# Patient Record
Sex: Female | Born: 1990 | Race: Black or African American | Hispanic: No | Marital: Single | Smoking: Current every day smoker
Health system: Southern US, Community
[De-identification: ages and names within clinical notes are randomized; demographics above are authoritative.]

---

## 2011-11-14 DIAGNOSIS — R0789 Other chest pain: Secondary | ICD-10-CM | POA: Insufficient documentation

## 2011-11-14 DIAGNOSIS — Z87891 Personal history of nicotine dependence: Secondary | ICD-10-CM | POA: Insufficient documentation

## 2011-11-15 ENCOUNTER — Emergency Department (HOSPITAL_BASED_OUTPATIENT_CLINIC_OR_DEPARTMENT_OTHER)
Admission: EM | Admit: 2011-11-15 | Discharge: 2011-11-15 | Disposition: A | Discharge status: Home / Self Care | Payer: Medicaid Other | Attending: Emergency Medicine | Admitting: Emergency Medicine

## 2011-11-15 ENCOUNTER — Encounter (HOSPITAL_BASED_OUTPATIENT_CLINIC_OR_DEPARTMENT_OTHER): Payer: Self-pay | Admitting: *Deleted

## 2011-11-15 ENCOUNTER — Emergency Department (HOSPITAL_BASED_OUTPATIENT_CLINIC_OR_DEPARTMENT_OTHER): Payer: Medicaid Other

## 2011-11-15 DIAGNOSIS — R0789 Other chest pain: Secondary | ICD-10-CM

## 2011-11-15 LAB — PREGNANCY, URINE: Preg Test, Ur: NEGATIVE

## 2011-11-15 LAB — D-DIMER, QUANTITATIVE: D-Dimer, Quant: 0.24 ug/mL-FEU (ref 0.00–0.48)

## 2011-11-15 MED ORDER — IBUPROFEN 800 MG PO TABS: 800.0000 mg | ORAL_TABLET | Freq: Three times a day (TID) | ORAL | Status: AC

## 2011-11-15 NOTE — ED Notes (Signed)
Patient states while sitting in class started having some tightness in her chest that caused her to experience some SOB.

## 2011-11-15 NOTE — ED Notes (Signed)
Pt states she was sitting in class on Tues and dev CP and shortness of breath. Worse with deep inspiration. Denies other s/s. Has happened off and on since. Neck "stiff" No pain at present.

## 2011-11-15 NOTE — ED Provider Notes (Signed)
History   This chart was scribed for Bonnie May Smitty Cords, MD by [name]. The patient was seen in room MH05/MH05. Patient's care was started at 2359.  CSN: [id number]  Arrival date & time 11/14/11  2359   First MD Initiated Contact with Patient 11/15/11 0018      Chief Complaint  Patient presents with  . Shortness of Breath    Patient is a 21 y.o. female presenting with shortness of breath and chest pain. The history is provided by the patient. No language interpreter was used.  Shortness of Breath  The current episode started 5 to 7 days ago. The onset was sudden. The problem occurs continuously (with intermittent sharp pains in chest and continuos chest tightness). The problem has been unchanged. The problem is moderate. Nothing relieves the symptoms. Nothing aggravates the symptoms. Associated symptoms include chest pain and shortness of breath. Pertinent negatives include no chest pressure, no fever, no rhinorrhea, no sore throat, no stridor, no cough and no wheezing. There was no intake of a foreign body. She has not inhaled smoke recently. She has had no prior steroid use. She has had no prior hospitalizations. Her past medical history does not include asthma. She has been behaving normally. Urine output has been normal. The last void occurred 6 to 12 hours ago. There were no sick contacts. She has received no recent medical care.  Chest Pain The chest pain began 5 - 7 days ago. Chest pain occurs constantly. The chest pain is unchanged. The pain is associated with breathing. At its most intense, the pain is at 7/10. The pain is currently at 7/10. The severity of the pain is severe. The quality of the pain is described as pleuritic and pressure-like. The pain does not radiate. Chest pain is worsened by deep breathing. Primary symptoms include shortness of breath. Pertinent negatives for primary symptoms include no fever, no fatigue, no syncope, no cough, no wheezing, no palpitations, no  abdominal pain, no nausea, no vomiting, no dizziness and no altered mental status.  The patient's medical history does not include asthma.  Pertinent negatives for associated symptoms include no claudication, no diaphoresis, no lower extremity edema and no weakness. Risk factors include no known risk factors.  Pertinent negatives for past medical history include no MI.  Procedure history is negative for cardiac catheterization.   denied neck pain or stiffness to EDP  History reviewed. No pertinent past medical history.  History reviewed. No pertinent past surgical history.  History reviewed. No pertinent family history.  History  Substance Use Topics  . Smoking status: Former Games developer  . Smokeless tobacco: Not on file  . Alcohol Use: No    Review of Systems  Constitutional: Negative for fever, diaphoresis and fatigue.  HENT: Negative for sore throat and rhinorrhea.   Eyes: Negative for pain.  Respiratory: Positive for chest tightness and shortness of breath. Negative for cough, wheezing and stridor.   Cardiovascular: Positive for chest pain. Negative for palpitations, claudication and syncope.  Gastrointestinal: Negative for nausea, vomiting, abdominal pain and diarrhea.  Genitourinary: Negative for dysuria.  Musculoskeletal: Negative for back pain.  Skin: Negative for rash.  Neurological: Negative for dizziness, weakness and headaches.  Psychiatric/Behavioral: Negative for altered mental status.  All other systems reviewed and are negative.    Allergies  Review of patient's allergies indicates no known allergies.  Home Medications  No current outpatient prescriptions on file.  BP 124/71  Pulse 88  Temp 98.1 F (36.7 C) (Oral)  Resp 18  Ht 5\' 6"  (1.676 m)  Wt 146 lb (66.225 kg)  BMI 23.56 kg/m2  SpO2 99%  LMP 10/14/2011  Physical Exam  Constitutional: She is oriented to person, place, and time. She appears well-developed and well-nourished. No distress.  HENT:    Head: Normocephalic and atraumatic.  Eyes: EOM are normal. Pupils are equal, round, and reactive to light. Right eye exhibits no discharge. Left eye exhibits no discharge.  Neck: Normal range of motion. Neck supple.  Cardiovascular: Normal rate, regular rhythm and normal heart sounds.   No murmur heard. Pulmonary/Chest: Effort normal and breath sounds normal. No respiratory distress. She has no wheezes. She has no rales. She exhibits tenderness.  Abdominal: There is no tenderness. There is no rebound and no guarding.  Musculoskeletal: Normal range of motion. She exhibits no edema.  Lymphadenopathy:    She has no cervical adenopathy.  Neurological: She is alert and oriented to person, place, and time. She has normal reflexes. She displays normal reflexes.       5/5 all 4 extremities.   Skin: Skin is warm and dry. She is not diaphoretic.    ED Course  Procedures (including critical care time) DIAGNOSTIC STUDIES: Oxygen Saturation is 99% on room air, normal by my interpretation.    COORDINATION OF CARE: 0014- Ordered Pregnancy, urine STAT. 0023- Evaluated Pt. Pt is awake, alert, and oriented. 36- Ordered ED EKG Once.    Labs Reviewed  PREGNANCY, URINE   No results found.   No diagnosis found.    MDM   Date: 11/15/2011  Rate:68  Rhythm: normal sinus rhythm  QRS Axis: normal  Intervals: normal  ST/T Wave abnormalities: normal  Conduction Disutrbances: none  Narrative Interpretation: unremarkable    Non cardiac chest pain.  No history family or personal.  No indication for cardiac markers.  Negative ddimer and CXR.  Follow up with your family doctor for ongoing care    I personally performed the services described in this documentation, which was scribed in my presence. The recorded information has been reviewed and considered.      Jasmine Awe, MD 11/15/11 [phone]

## 2012-08-26 DIAGNOSIS — F988 Other specified behavioral and emotional disorders with onset usually occurring in childhood and adolescence: Secondary | ICD-10-CM | POA: Insufficient documentation

## 2012-09-06 DIAGNOSIS — F32A Depression, unspecified: Secondary | ICD-10-CM | POA: Insufficient documentation

## 2014-01-11 IMAGING — CR DG CHEST 2V
2 series · 2 of 2 positions shown · non-contrast
Comparison: None.

CLINICAL DATA: Chest pain, shortness of breath.

CHEST - 2 VIEW

[w chest pa]
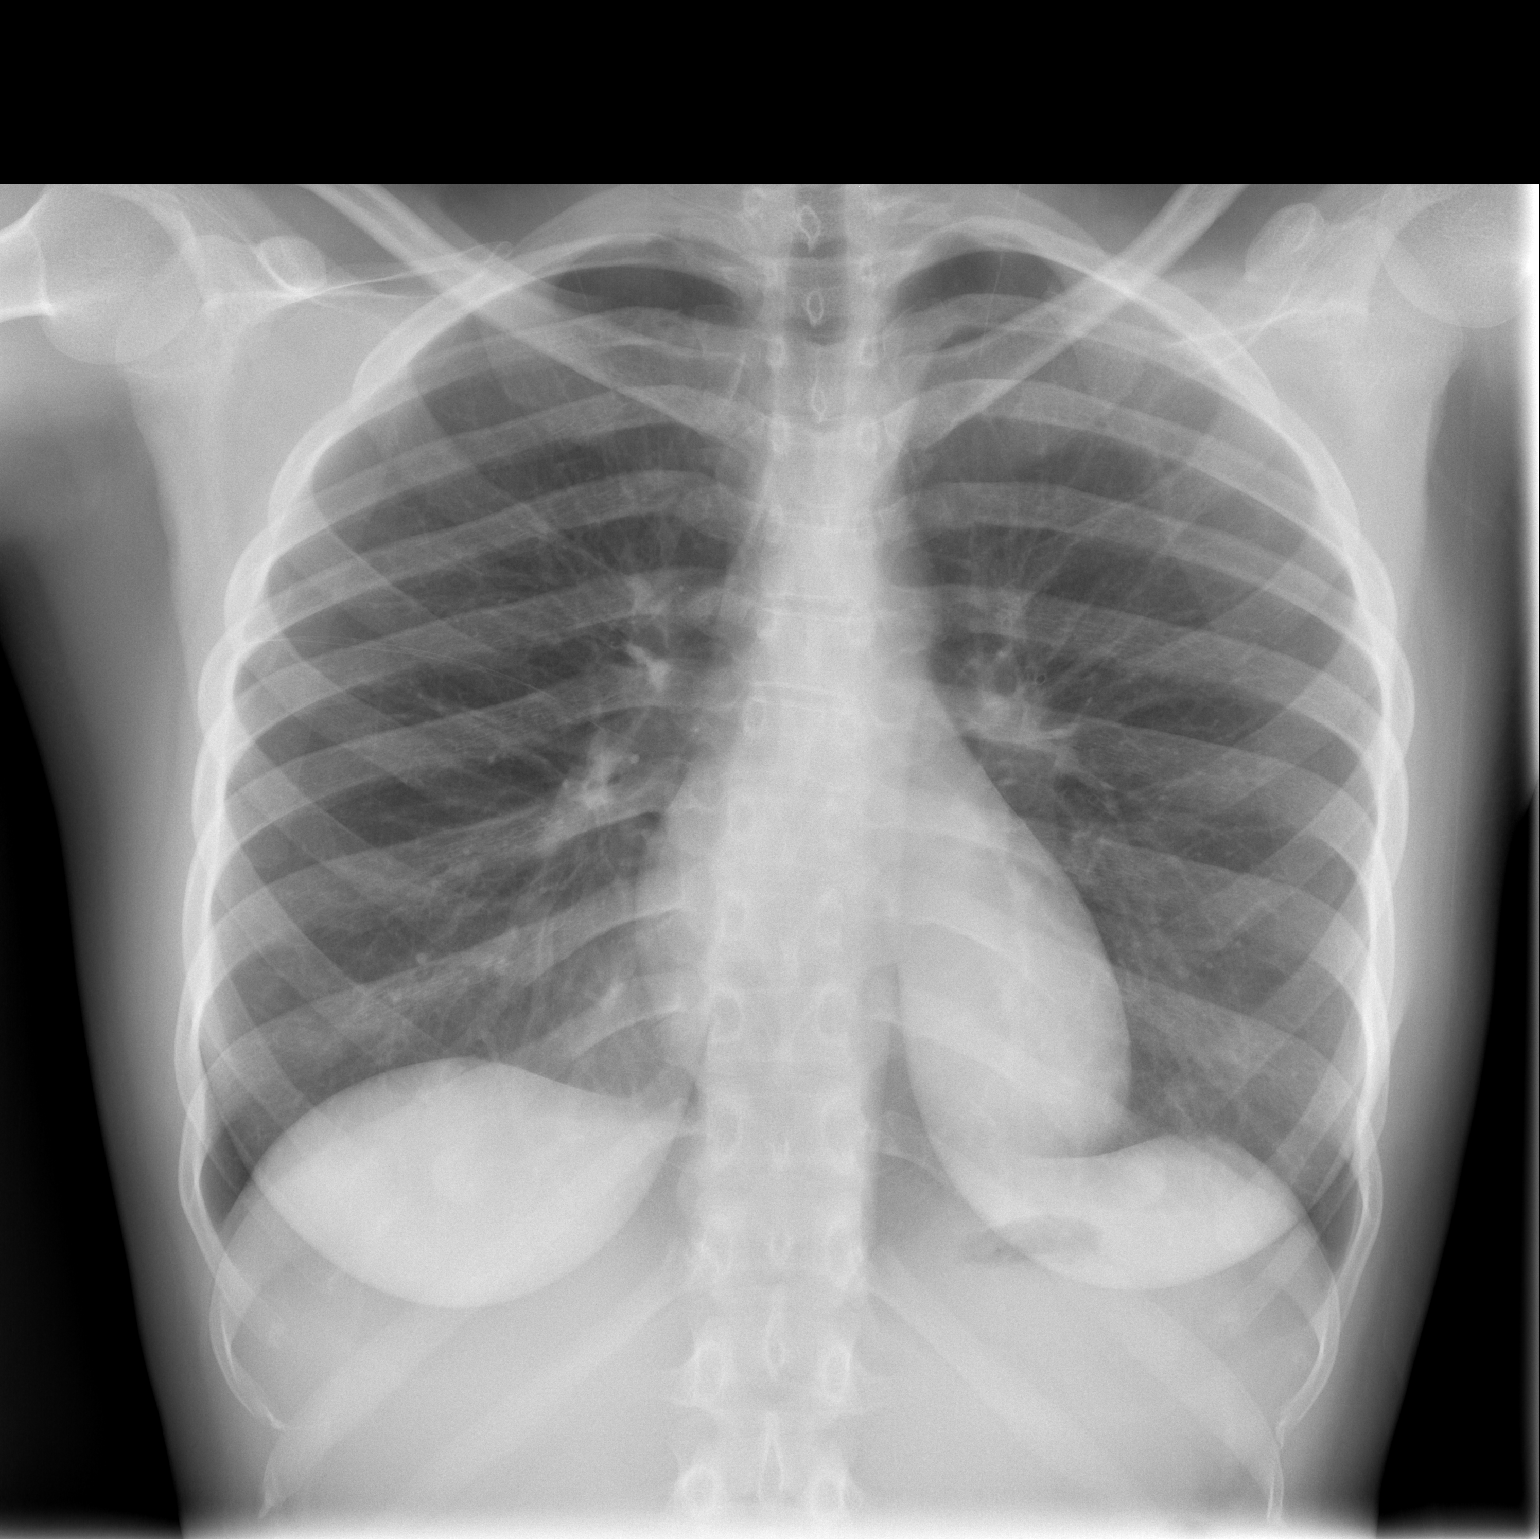

[w chest lat]
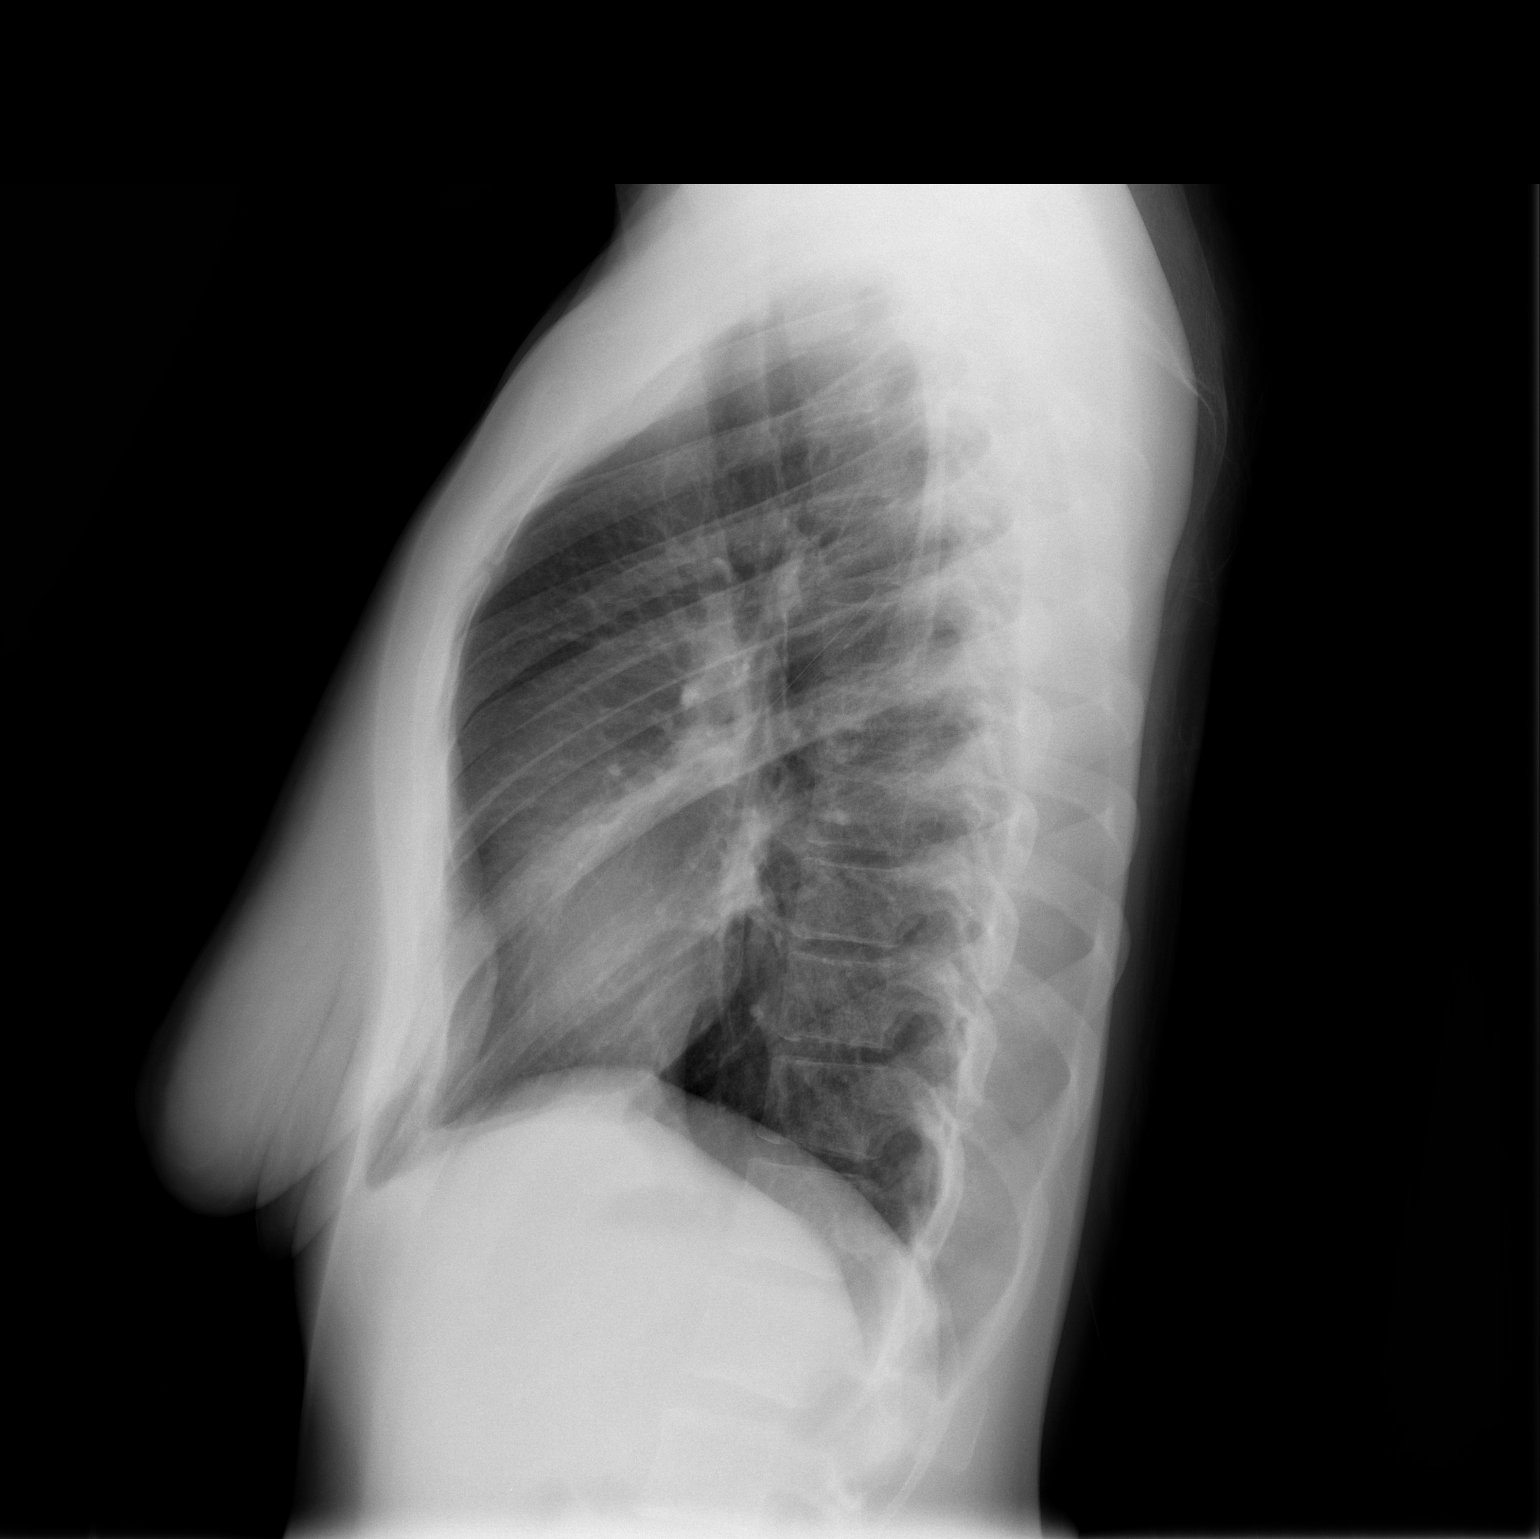

[2 of 2 positions shown; findings below may reference images not displayed]

FINDINGS: Lungs are clear. No pleural effusion or pneumothorax. The
cardiomediastinal contours are within normal limits. The visualized
bones and soft tissues are without significant appreciable
abnormality.
IMPRESSION: No radiographic evidence of acute cardiopulmonary process.

## 2014-04-06 ENCOUNTER — Emergency Department (HOSPITAL_BASED_OUTPATIENT_CLINIC_OR_DEPARTMENT_OTHER)
Admission: EM | Admit: 2014-04-06 | Discharge: 2014-04-06 | Disposition: A | Discharge status: Home / Self Care | Payer: Medicaid Other | Attending: Emergency Medicine | Admitting: Emergency Medicine

## 2014-04-06 ENCOUNTER — Encounter (HOSPITAL_BASED_OUTPATIENT_CLINIC_OR_DEPARTMENT_OTHER): Payer: Self-pay | Admitting: Emergency Medicine

## 2014-04-06 DIAGNOSIS — K088 Other specified disorders of teeth and supporting structures: Secondary | ICD-10-CM | POA: Insufficient documentation

## 2014-04-06 DIAGNOSIS — K047 Periapical abscess without sinus: Secondary | ICD-10-CM

## 2014-04-06 DIAGNOSIS — Z87891 Personal history of nicotine dependence: Secondary | ICD-10-CM | POA: Insufficient documentation

## 2014-04-06 DIAGNOSIS — K0889 Other specified disorders of teeth and supporting structures: Secondary | ICD-10-CM

## 2014-04-06 MED ORDER — HYDROCODONE-ACETAMINOPHEN 5-325 MG PO TABS: 1.0000 | ORAL_TABLET | ORAL | Status: DC | PRN

## 2014-04-06 MED ORDER — OXYCODONE-ACETAMINOPHEN 5-325 MG PO TABS
1.0000 | ORAL_TABLET | Freq: Once | ORAL | Status: AC
  Administered 2014-04-06: 1 via ORAL
  Filled 2014-04-06: qty 1

## 2014-04-06 MED ORDER — AMOXICILLIN 500 MG PO CAPS: 500.0000 mg | ORAL_CAPSULE | Freq: Three times a day (TID) | ORAL | Status: DC

## 2014-04-06 NOTE — ED Provider Notes (Signed)
CSN: [id number]     Arrival date & time 04/06/14  2154 History   First MD Initiated Contact with Patient 04/06/14 2234     Chief Complaint  Patient presents with  . Dental Pain     (Consider location/radiation/quality/duration/timing/severity/associated sxs/prior Treatment) HPI Comments: 24 year old female presenting with her mother complaining of right lower dental pain "for a while" worsening over the past 2 days. Patient reports she has a chipped off tooth and a filling that fell out on that side. Over the past 2 days she started mild right-sided facial swelling. Denies fevers. She cannot chew on that side. She has tried taking Tylenol with no relief. She does not have a dentist at this time.  Patient is a 24 y.o. female presenting with tooth pain. The history is provided by the patient.  Dental Pain Associated symptoms: facial swelling   Associated symptoms: no fever     History reviewed. No pertinent past medical history. History reviewed. No pertinent past surgical history. No family history on file. History  Substance Use Topics  . Smoking status: Former Games developermoker  . Smokeless tobacco: Not on file  . Alcohol Use: No   OB History    No data available     Review of Systems  Constitutional: Negative for fever.  HENT: Positive for dental problem and facial swelling.   Respiratory: Negative.   Cardiovascular: Negative.   Gastrointestinal: Negative for vomiting.  Neurological: Negative.       Allergies  Review of patient's allergies indicates no known allergies.  Home Medications   Prior to Admission medications   Medication Sig Start Date End Date Taking? Authorizing Provider  amoxicillin (AMOXIL) 500 MG capsule Take 1 capsule (500 mg total) by mouth 3 (three) times daily. 04/06/14   Kathrynn Speedobyn M Wayne Brunker, PA-C  HYDROcodone-acetaminophen (NORCO/VICODIN) 5-325 MG per tablet Take 1-2 tablets by mouth every 4 (four) hours as needed. 04/06/14   Tiffanni Scarfo M Ashni Lonzo, PA-C   BP 117/75 mmHg   Pulse 86  Temp(Src) 98.6 F (37 C) (Oral)  Resp 20  Ht 5\' 6"  (1.676 m)  Wt 170 lb (77.111 kg)  BMI 27.45 kg/m2  SpO2 100%  LMP 04/01/2014 Physical Exam  Constitutional: She is oriented to person, place, and time. She appears well-developed and well-nourished. No distress.  HENT:  Head: Normocephalic and atraumatic.  Mouth/Throat: Oropharynx is clear and moist.    Eyes: Conjunctivae and EOM are normal.  Neck: Normal range of motion. Neck supple.  Cardiovascular: Normal rate, regular rhythm and normal heart sounds.   Pulmonary/Chest: Effort normal and breath sounds normal. No respiratory distress.  Musculoskeletal: Normal range of motion. She exhibits no edema.  Lymphadenopathy:       Head (right side): Submandibular adenopathy present.  Neurological: She is alert and oriented to person, place, and time. No sensory deficit.  Skin: Skin is warm and dry.  Psychiatric: She has a normal mood and affect. Her behavior is normal.  Nursing note and vitals reviewed.   ED Course  Procedures (including critical care time) Labs Review Labs Reviewed - No data to display  Imaging Review No results found.   EKG Interpretation None      MDM   Final diagnoses:  Dental infection  Pain, dental    Dental pain associated with dental infection. No evidence of dental abscess. Patient is afebrile, non toxic appearing and swallowing secretions well. I gave patient referral to dentist and stressed the importance of dental follow up for ultimate management of dental  pain. I will also give amoxicillin and pain control. Patient voices understanding and is agreeable to plan.  Kathrynn Speed, PA-C 04/06/14 2243  Vanetta Mulders, MD 04/07/14 [phone]

## 2014-04-06 NOTE — Discharge Instructions (Signed)
Take amoxicillin three times daily for 1 week. Take Vicodin for severe pain only. No driving or operating heavy machinery while taking vicodin. This medication may cause drowsiness.  Dental Pain A tooth ache may be caused by cavities (tooth decay). Cavities expose the nerve of the tooth to air and hot or cold temperatures. It may come from an infection or abscess (also called a boil or furuncle) around your tooth. It is also often caused by dental caries (tooth decay). This causes the pain you are having. DIAGNOSIS  Your caregiver can diagnose this problem by exam. TREATMENT   If caused by an infection, it may be treated with medications which kill germs (antibiotics) and pain medications as prescribed by your caregiver. Take medications as directed.  Only take over-the-counter or prescription medicines for pain, discomfort, or fever as directed by your caregiver.  Whether the tooth ache today is caused by infection or dental disease, you should see your dentist as soon as possible for further care. SEEK MEDICAL CARE IF: The exam and treatment you received today has been provided on an emergency basis only. This is not a substitute for complete medical or dental care. If your problem worsens or new problems (symptoms) appear, and you are unable to meet with your dentist, call or return to this location. SEEK IMMEDIATE MEDICAL CARE IF:   You have a fever.  You develop redness and swelling of your face, jaw, or neck.  You are unable to open your mouth.  You have severe pain uncontrolled by pain medicine. MAKE SURE YOU:   Understand these instructions.  Will watch your condition.  Will get help right away if you are not doing well or get worse. Document Released: 03/08/2005 Document Revised: 05/31/2011 Document Reviewed: 10/25/2007 [facility] Patient Information 2015 [employer], [place]. This information is not intended to replace advice given to you by your health care provider. Make sure you  discuss any questions you have with your health care provider.  Facial Infection You have an infection of your face. This requires special attention to help prevent serious problems. Infections in facial wounds can cause poor healing and scars. They can also spread to deeper tissues, especially around the eye. Wound and dental infections can lead to sinusitis, infection of the eye socket, and even meningitis. Permanent damage to the skin, eye, and nervous system may result if facial infections are not treated properly. With severe infections, hospital care for IV antibiotic injections may be needed if they don't respond to oral antibiotics. Antibiotics must be taken for the full course to insure the infection is eliminated. If the infection came from a bad tooth, it may have to be extracted when the infection is under control. Warm compresses may be applied to reduce skin irritation and remove drainage. You might need a tetanus shot now if:  You cannot remember when your last tetanus shot was.  You have never had a tetanus shot.  The object that caused your wound was dirty. If you need a tetanus shot, and you decide not to get one, there is a rare chance of getting tetanus. Sickness from tetanus can be serious. If you got a tetanus shot, your arm may swell, get red and warm to the touch at the shot site. This is common and not a problem. SEEK IMMEDIATE MEDICAL CARE IF:   You have increased swelling, redness, or trouble breathing.  You have a severe headache, dizziness, nausea, or vomiting.  You develop problems with your eyesight.  You have a fever. °Document Released: 04/15/2004 Document Revised: 05/31/2011 Document Reviewed: 03/08/2005 °[employer]® Patient Information ©2015 [employer]. This information is not intended to replace advice given to you by your health care provider. Make sure you discuss any questions you have with your health care provider. ° °

## 2014-04-06 NOTE — ED Notes (Signed)
Pt presents to ED with complaints of right lower dental pain for 2 days

## 2014-06-01 ENCOUNTER — Other Ambulatory Visit: Payer: Self-pay

## 2014-06-01 ENCOUNTER — Emergency Department (HOSPITAL_BASED_OUTPATIENT_CLINIC_OR_DEPARTMENT_OTHER)
Admission: EM | Admit: 2014-06-01 | Discharge: 2014-06-01 | Disposition: A | Discharge status: Home / Self Care | Payer: Medicaid Other | Attending: Emergency Medicine | Admitting: Emergency Medicine

## 2014-06-01 ENCOUNTER — Encounter (HOSPITAL_BASED_OUTPATIENT_CLINIC_OR_DEPARTMENT_OTHER): Payer: Self-pay

## 2014-06-01 DIAGNOSIS — Z3490 Encounter for supervision of normal pregnancy, unspecified, unspecified trimester: Secondary | ICD-10-CM

## 2014-06-01 DIAGNOSIS — Z792 Long term (current) use of antibiotics: Secondary | ICD-10-CM | POA: Diagnosis not present

## 2014-06-01 DIAGNOSIS — R61 Generalized hyperhidrosis: Secondary | ICD-10-CM | POA: Insufficient documentation

## 2014-06-01 DIAGNOSIS — R002 Palpitations: Secondary | ICD-10-CM | POA: Insufficient documentation

## 2014-06-01 DIAGNOSIS — F17219 Nicotine dependence, cigarettes, with unspecified nicotine-induced disorders: Secondary | ICD-10-CM | POA: Insufficient documentation

## 2014-06-01 DIAGNOSIS — O99331 Smoking (tobacco) complicating pregnancy, first trimester: Secondary | ICD-10-CM | POA: Insufficient documentation

## 2014-06-01 DIAGNOSIS — Z3A00 Weeks of gestation of pregnancy not specified: Secondary | ICD-10-CM | POA: Insufficient documentation

## 2014-06-01 DIAGNOSIS — O2651 Maternal hypotension syndrome, first trimester: Secondary | ICD-10-CM | POA: Diagnosis not present

## 2014-06-01 DIAGNOSIS — O9989 Other specified diseases and conditions complicating pregnancy, childbirth and the puerperium: Secondary | ICD-10-CM | POA: Insufficient documentation

## 2014-06-01 DIAGNOSIS — R079 Chest pain, unspecified: Secondary | ICD-10-CM | POA: Insufficient documentation

## 2014-06-01 LAB — CBC WITH DIFFERENTIAL/PLATELET
Basophils Absolute: 0 10*3/uL (ref 0.0–0.1)
Basophils Relative: 0 % (ref 0–1)
EOS ABS: 0.1 10*3/uL (ref 0.0–0.7)
Eosinophils Relative: 2 % (ref 0–5)
HEMATOCRIT: 35 % — AB (ref 36.0–46.0)
HEMOGLOBIN: 11.4 g/dL — AB (ref 12.0–15.0)
LYMPHS ABS: 2.2 10*3/uL (ref 0.7–4.0)
Lymphocytes Relative: 40 % (ref 12–46)
MCH: 29.9 pg (ref 26.0–34.0)
MCHC: 32.6 g/dL (ref 30.0–36.0)
MCV: 91.9 fL (ref 78.0–100.0)
MONO ABS: 0.6 10*3/uL (ref 0.1–1.0)
Monocytes Relative: 11 % (ref 3–12)
NEUTROS ABS: 2.6 10*3/uL (ref 1.7–7.7)
Neutrophils Relative %: 47 % (ref 43–77)
Platelets: 204 10*3/uL (ref 150–400)
RBC: 3.81 MIL/uL — ABNORMAL LOW (ref 3.87–5.11)
RDW: 13.5 % (ref 11.5–15.5)
WBC: 5.5 10*3/uL (ref 4.0–10.5)

## 2014-06-01 LAB — URINALYSIS, ROUTINE W REFLEX MICROSCOPIC
BILIRUBIN URINE: NEGATIVE
Glucose, UA: NEGATIVE mg/dL
Hgb urine dipstick: NEGATIVE
Ketones, ur: NEGATIVE mg/dL
Leukocytes, UA: NEGATIVE
Nitrite: NEGATIVE
PH: 6.5 (ref 5.0–8.0)
PROTEIN: NEGATIVE mg/dL
Specific Gravity, Urine: 1.024 (ref 1.005–1.030)
UROBILINOGEN UA: 1 mg/dL (ref 0.0–1.0)

## 2014-06-01 LAB — COMPREHENSIVE METABOLIC PANEL
ALK PHOS: 45 U/L (ref 39–117)
ALT: 19 U/L (ref 0–35)
ANION GAP: 8 (ref 5–15)
AST: 18 U/L (ref 0–37)
Albumin: 3.6 g/dL (ref 3.5–5.2)
BUN: 13 mg/dL (ref 6–23)
CALCIUM: 8.9 mg/dL (ref 8.4–10.5)
CO2: 24 mmol/L (ref 19–32)
Chloride: 105 mmol/L (ref 96–112)
Creatinine, Ser: 0.6 mg/dL (ref 0.50–1.10)
GFR calc non Af Amer: >90 mL/min (ref >90)
Glucose, Bld: 93 mg/dL (ref 70–99)
Potassium: 3.9 mmol/L (ref 3.5–5.1)
SODIUM: 137 mmol/L (ref 135–145)
TOTAL PROTEIN: 6.9 g/dL (ref 6.0–8.3)
Total Bilirubin: 0.3 mg/dL (ref 0.3–1.2)

## 2014-06-01 LAB — HCG, QUANTITATIVE, PREGNANCY: hCG, Beta Chain, Quant, S: 244 m[IU]/mL — ABNORMAL HIGH (ref <5)

## 2014-06-01 LAB — PREGNANCY, URINE: Preg Test, Ur: POSITIVE — AB

## 2014-06-01 LAB — TROPONIN I

## 2014-06-01 MED ORDER — PRENATAL COMPLETE 14-0.4 MG PO TABS: 1.0000 | ORAL_TABLET | Freq: Every day | ORAL | Status: DC

## 2014-06-01 NOTE — ED Notes (Signed)
Pt reports that last night she was having chest pressure and palpitations.  Denies current.  States since this episode has had hypotension with systolic in the 90s.

## 2014-06-01 NOTE — Discharge Instructions (Signed)
First Trimester of Pregnancy °The first trimester of pregnancy is from week 1 until the end of week 12 (months 1 through 3). A week after a sperm fertilizes an egg, the egg will implant on the wall of the uterus. This embryo will begin to develop into a baby. Genes from you and your partner are forming the baby. The female genes determine whether the baby is a boy or a girl. At 6-8 weeks, the eyes and face are formed, and the heartbeat can be seen on ultrasound. At the end of 12 weeks, all the baby's organs are formed.  °Now that you are pregnant, you will want to do everything you can to have a healthy baby. Two of the most important things are to get good prenatal care and to follow your health care provider's instructions. Prenatal care is all the medical care you receive before the baby's birth. This care will help prevent, find, and treat any problems during the pregnancy and childbirth. °BODY CHANGES °Your body goes through many changes during pregnancy. The changes vary from woman to woman.  °· You may gain or lose a couple of pounds at first. °· You may feel sick to your stomach (nauseous) and throw up (vomit). If the vomiting is uncontrollable, call your health care provider. °· You may tire easily. °· You may develop headaches that can be relieved by medicines approved by your health care provider. °· You may urinate more often. Painful urination may mean you have a bladder infection. °· You may develop heartburn as a result of your pregnancy. °· You may develop constipation because certain hormones are causing the muscles that push waste through your intestines to slow down. °· You may develop hemorrhoids or swollen, bulging veins (varicose veins). °· Your breasts may begin to grow larger and become tender. Your nipples may stick out more, and the tissue that surrounds them (areola) may become darker. °· Your gums may bleed and may be sensitive to brushing and flossing. °· Dark spots or blotches (chloasma,  mask of pregnancy) may develop on your face. This will likely fade after the baby is born. °· Your menstrual periods will stop. °· You may have a loss of appetite. °· You may develop cravings for certain kinds of food. °· You may have changes in your emotions from day to day, such as being excited to be pregnant or being concerned that something may go wrong with the pregnancy and baby. °· You may have more vivid and strange dreams. °· You may have changes in your hair. These can include thickening of your hair, rapid growth, and changes in texture. Some women also have hair loss during or after pregnancy, or hair that feels dry or thin. Your hair will most likely return to normal after your baby is born. °WHAT TO EXPECT AT YOUR PRENATAL VISITS °During a routine prenatal visit: °· You will be weighed to make sure you and the baby are growing normally. °· Your blood pressure will be taken. °· Your abdomen will be measured to track your baby's growth. °· The fetal heartbeat will be listened to starting around week 10 or 12 of your pregnancy. °· Test results from any previous visits will be discussed. °Your health care provider may ask you: °· How you are feeling. °· If you are feeling the baby move. °· If you have had any abnormal symptoms, such as leaking fluid, bleeding, severe headaches, or abdominal cramping. °· If you have any questions. °Other tests   that may be performed during your first trimester include: °· Blood tests to find your blood type and to check for the presence of any previous infections. They will also be used to check for low iron levels (anemia) and Rh antibodies. Later in the pregnancy, blood tests for diabetes will be done along with other tests if problems develop. °· Urine tests to check for infections, diabetes, or protein in the urine. °· An ultrasound to confirm the proper growth and development of the baby. °· An amniocentesis to check for possible genetic problems. °· Fetal screens for  spina bifida and Down syndrome. °· You may need other tests to make sure you and the baby are doing well. °HOME CARE INSTRUCTIONS  °Medicines °· Follow your health care provider's instructions regarding medicine use. Specific medicines may be either safe or unsafe to take during pregnancy. °· Take your prenatal vitamins as directed. °· If you develop constipation, try taking a stool softener if your health care provider approves. °Diet °· Eat regular, well-balanced meals. Choose a variety of foods, such as meat or vegetable-based protein, fish, milk and low-fat dairy products, vegetables, fruits, and whole grain breads and cereals. Your health care provider will help you determine the amount of weight gain that is right for you. °· Avoid raw meat and uncooked cheese. These carry germs that can cause birth defects in the baby. °· Eating four or five small meals rather than three large meals a day may help relieve nausea and vomiting. If you start to feel nauseous, eating a few soda crackers can be helpful. Drinking liquids between meals instead of during meals also seems to help nausea and vomiting. °· If you develop constipation, eat more high-fiber foods, such as fresh vegetables or fruit and whole grains. Drink enough fluids to keep your urine clear or pale yellow. °Activity and Exercise °· Exercise only as directed by your health care provider. Exercising will help you: °¨ Control your weight. °¨ Stay in shape. °¨ Be prepared for labor and delivery. °· Experiencing pain or cramping in the lower abdomen or low back is a good sign that you should stop exercising. Check with your health care provider before continuing normal exercises. °· Try to avoid standing for long periods of time. Move your legs often if you must stand in one place for a long time. °· Avoid heavy lifting. °· Wear low-heeled shoes, and practice good posture. °· You may continue to have sex unless your health care provider directs you  otherwise. °Relief of Pain or Discomfort °· Wear a good support bra for breast tenderness.   °· Take warm sitz baths to soothe any pain or discomfort caused by hemorrhoids. Use hemorrhoid cream if your health care provider approves.   °· Rest with your legs elevated if you have leg cramps or low back pain. °· If you develop varicose veins in your legs, wear support hose. Elevate your feet for 15 minutes, 3-4 times a day. Limit salt in your diet. °Prenatal Care °· Schedule your prenatal visits by the twelfth week of pregnancy. They are usually scheduled monthly at first, then more often in the last 2 months before delivery. °· Write down your questions. Take them to your prenatal visits. °· Keep all your prenatal visits as directed by your health care provider. °Safety °· Wear your seat belt at all times when driving. °· Make a list of emergency phone numbers, including numbers for family, friends, the hospital, and police and fire departments. °General Tips °·   Ask your health care provider for a referral to a local prenatal education class. Begin classes no later than at the beginning of month 6 of your pregnancy.  Ask for help if you have counseling or nutritional needs during pregnancy. Your health care provider can offer advice or refer you to specialists for help with various needs.  Do not use hot tubs, steam rooms, or saunas.  Do not douche or use tampons or scented sanitary pads.  Do not cross your legs for long periods of time.  Avoid cat litter boxes and soil used by cats. These carry germs that can cause birth defects in the baby and possibly loss of the fetus by miscarriage or stillbirth.  Avoid all smoking, herbs, alcohol, and medicines not prescribed by your health care provider. Chemicals in these affect the formation and growth of the baby.  Schedule a dentist appointment. At home, brush your teeth with a soft toothbrush and be gentle when you floss. SEEK MEDICAL CARE IF:   You have  dizziness.  You have mild pelvic cramps, pelvic pressure, or nagging pain in the abdominal area.  You have persistent nausea, vomiting, or diarrhea.  You have a bad smelling vaginal discharge.  You have pain with urination.  You notice increased swelling in your face, hands, legs, or ankles. SEEK IMMEDIATE MEDICAL CARE IF:   You have a fever.  You are leaking fluid from your vagina.  You have spotting or bleeding from your vagina.  You have severe abdominal cramping or pain.  You have rapid weight gain or loss.  You vomit blood or material that looks like coffee grounds.  You are exposed to [place] and have never had them.  You are exposed to fifth disease or chickenpox.  You develop a severe headache.  You have shortness of breath.  You have any kind of trauma, such as from a fall or a car accident. Document Released: 03/02/2001 Document Revised: 07/23/2013 Document Reviewed: 01/16/2013 [facility] Patient Information 2015 [employer] [place]. This information is not intended to replace advice given to you by your health care provider. Make sure you discuss any questions you have with your health care provider. Hypotension As your heart beats, it forces blood through your arteries. This force is your blood pressure. If your blood pressure is too low for you to go about your normal activities or to support the organs of your body, you have hypotension. Hypotension is also referred to as low blood pressure. When your blood pressure becomes too low, you may not get enough blood to your brain. As a result, you may feel weak, feel lightheaded, or develop a rapid heart rate. In a more severe case, you may faint. CAUSES Various conditions can cause hypotension. These include:  Blood loss.  Dehydration.  Heart or endocrine problems.  Pregnancy.  Severe infection.  Not having a well-balanced diet filled with needed nutrients.  Severe allergic reactions  (anaphylaxis). Some medicines, such as blood pressure medicine or water pills (diuretics), may lower your blood pressure below normal. Sometimes taking too much medicine or taking medicine not as directed can cause hypotension. TREATMENT  Hospitalization is sometimes required for hypotension if fluid or blood replacement is needed, if time is needed for medicines to wear off, or if further monitoring is needed. Treatment might include changing your diet, changing your medicines (including medicines aimed at raising your blood pressure), and use of support stockings. HOME CARE INSTRUCTIONS   Drink enough fluids to keep your urine  clear or pale yellow.  Take your medicines as directed by your health care provider.  Get up slowly from reclining or sitting positions. This gives your blood pressure a chance to adjust.  Wear support stockings as directed by your health care provider.  Maintain a healthy diet by including nutritious food, such as fruits, vegetables, nuts, whole grains, and lean meats. SEEK MEDICAL CARE IF:  You have vomiting or diarrhea.  You have a fever for more than 2-3 days.  You feel more thirsty than usual.  You feel weak and tired. SEEK IMMEDIATE MEDICAL CARE IF:   You have chest pain or a fast or irregular heartbeat.  You have a loss of feeling in some part of your body, or you lose movement in your arms or legs.  You have trouble speaking.  You become sweaty or feel lightheaded.  You faint. MAKE SURE YOU:   Understand these instructions.  Will watch your condition.  Will get help right away if you are not doing well or get worse. Document Released: 03/08/2005 Document Revised: 12/27/2012 Document Reviewed: 09/08/2012 [facility] Patient Information 2015 [place], [place]. This information is not intended to replace advice given to you by your health care provider. Make sure you discuss any questions you have with your health care provider.

## 2014-06-01 NOTE — ED Provider Notes (Signed)
CSN: [id number]     Arrival date & time 06/01/14  1952 History  This chart was scribed for Arby BarretteMarcy Boone Gear, MD by Modena JanskyAlbert Thayil, ED Scribe. This patient was seen in room MH03/MH03 and the patient's care was started at 8:43 PM.   Chief Complaint  Patient presents with  . Hypotension   Patient is a 24 y.o. female presenting with chest pain. The history is provided by the patient. No language interpreter was used.  Chest Pain Pain quality: pressure   Pain radiates to:  Does not radiate Pain radiates to the back: no   Pain severity:  Moderate Onset quality:  Sudden Duration:  1 day Timing:  Constant Progression:  Resolved Chronicity:  New Context: at rest   Relieved by:  None tried Worsened by:  Nothing tried Ineffective treatments:  None tried Associated symptoms: diaphoresis, dizziness and palpitations   Associated symptoms: no nausea and not vomiting   Risk factors: pregnancy     HPI Comments: Bonnie May is a 24 y.o. female who presents to the Emergency Department complaining of a feeling of racing heart that occured last night. She felt like her heart was beating hard and she put her hand on her chest and could feel it beating fast and hard under her hand. She reports she felt like there was some pressure in her chest so she went outside and got some fresh air and it resolved spontaneously. She reports after that she checked her blood pressure and her blood pressures were in the 90s/ 60s. She states that she checked her blood pressure at home today and it was 94/60, when her usual reading is 117/70, so she came to the ED. Yesterday evening's episode of palpitations lasted about 25 minutes. It resolved spontaneously and once that was resolved she did not have any recurrent symptoms.  She denies any family hx of heart disease. She also denies any nausea or vomiting. She denies she had been expressing any other symptoms or feeling unwell aside from the isolated episode last night. I  informed her that her pregnancy test was positive. She was unaware being pregnant and anticipated having her period this upcoming week. She denies she's been having any symptoms of morning sickness, weakness, nausea, vomiting or diarrhea. She denies she's been experiencing any abdominal pain or abnormal vaginal discharge.  History reviewed. No pertinent past medical history. History reviewed. No pertinent past surgical history. No family history on file. History  Substance Use Topics  . Smoking status: Current Every Day Smoker    Types: Cigarettes  . Smokeless tobacco: Not on file  . Alcohol Use: Yes   OB History    No data available     Review of Systems  Constitutional: Positive for diaphoresis.  Cardiovascular: Positive for chest pain and palpitations.  Gastrointestinal: Negative for nausea and vomiting.  Neurological: Positive for dizziness.  10 Systems reviewed and all are negative for acute change except as noted in the HPI.  Allergies  Review of patient's allergies indicates no known allergies.  Home Medications   Prior to Admission medications   Medication Sig Start Date End Date Taking? Authorizing Provider  amoxicillin (AMOXIL) 500 MG capsule Take 1 capsule (500 mg total) by mouth 3 (three) times daily. 04/06/14   Kathrynn Speedobyn M Hess, PA-C  HYDROcodone-acetaminophen (NORCO/VICODIN) 5-325 MG per tablet Take 1-2 tablets by mouth every 4 (four) hours as needed. 04/06/14   Kathrynn Speedobyn M Hess, PA-C  Prenatal Vit-Fe Fumarate-FA (PRENATAL COMPLETE) 14-0.4 MG TABS Take 1  tablet by mouth daily. 06/01/14   Arby Barrette, MD   BP 117/83 mmHg  Pulse 73  Temp(Src) 98.4 F (36.9 C) (Oral)  Resp 16  Ht  (1.676 m)  Wt 170 lb (77.111 kg)  BMI 27.45 kg/m2  SpO2 100%  LMP 05/10/2014 Physical Exam  Constitutional: She is oriented to person, place, and time. She appears well-developed and well-nourished.  HENT:  Head: Normocephalic and atraumatic.  Mouth/Throat: Oropharynx is clear and  moist. No oropharyngeal exudate.  Eyes: EOM are normal. Pupils are equal, round, and reactive to light.  Neck: Neck supple.  Cardiovascular: Normal rate, regular rhythm, normal heart sounds and intact distal pulses.   Pulmonary/Chest: Effort normal and breath sounds normal.  Abdominal: Soft. Bowel sounds are normal. She exhibits no distension. There is no tenderness.  Musculoskeletal: Normal range of motion. She exhibits no edema or tenderness.  Neurological: She is alert and oriented to person, place, and time. She has normal strength. Coordination normal. GCS eye subscore is 4. GCS verbal subscore is 5. GCS motor subscore is 6.  Skin: Skin is warm, dry and intact.  Psychiatric: She has a normal mood and affect.    ED Course  Procedures (including critical care time) DIAGNOSTIC STUDIES: Oxygen Saturation is 100% on RA, normal by my interpretation.    COORDINATION OF CARE: 8:47 PM- Pt advised of plan for treatment which includes labs and pt agrees.  Labs Review Labs Reviewed  URINALYSIS, ROUTINE W REFLEX MICROSCOPIC - Abnormal; Notable for the following:    APPearance CLOUDY (*)    All other components within normal limits  PREGNANCY, URINE - Abnormal; Notable for the following:    Preg Test, Ur POSITIVE (*)    All other components within normal limits  CBC WITH DIFFERENTIAL/PLATELET - Abnormal; Notable for the following:    RBC 3.81 (*)    Hemoglobin 11.4 (*)    HCT 35.0 (*)    All other components within normal limits  HCG, QUANTITATIVE, PREGNANCY - Abnormal; Notable for the following:    hCG, Beta Chain, Quant, S 244 (*)    All other components within normal limits  COMPREHENSIVE METABOLIC PANEL  TROPONIN I  ABO/RH    Imaging Review No results found.   EKG Interpretation None      MDM   Final diagnoses:  Pregnancy  Palpitation  Maternal hypotension syndrome in first trimester   The patient is well in appearance. She has a normal physical examination. She was  found to be pregnant, very early. She has not had any vaginal bleeding and she has no associated abdominal pain. She has no findings to suggest infectious illness. She has no lower extremity swelling or pain. There is no associated pleuritic chest pain. She is asymptomatic of any lightheadedness or dizziness.   Arby Barrette, MD 06/01/14 2308

## 2014-06-02 LAB — ABO/RH: ABO/RH(D): O POS

## 2016-02-15 ENCOUNTER — Emergency Department (HOSPITAL_BASED_OUTPATIENT_CLINIC_OR_DEPARTMENT_OTHER): Payer: Medicaid Other

## 2016-02-15 ENCOUNTER — Emergency Department (HOSPITAL_BASED_OUTPATIENT_CLINIC_OR_DEPARTMENT_OTHER)
Admission: EM | Admit: 2016-02-15 | Discharge: 2016-02-15 | Disposition: A | Discharge status: Home / Self Care | Payer: Medicaid Other | Attending: Emergency Medicine | Admitting: Emergency Medicine

## 2016-02-15 DIAGNOSIS — R079 Chest pain, unspecified: Secondary | ICD-10-CM | POA: Diagnosis present

## 2016-02-15 DIAGNOSIS — R0789 Other chest pain: Secondary | ICD-10-CM | POA: Diagnosis not present

## 2016-02-15 DIAGNOSIS — F17210 Nicotine dependence, cigarettes, uncomplicated: Secondary | ICD-10-CM | POA: Diagnosis not present

## 2016-02-15 LAB — BASIC METABOLIC PANEL
ANION GAP: 7 (ref 5–15)
BUN: 16 mg/dL (ref 6–20)
CALCIUM: 9.3 mg/dL (ref 8.9–10.3)
CHLORIDE: 102 mmol/L (ref 101–111)
CO2: 28 mmol/L (ref 22–32)
Creatinine, Ser: 0.77 mg/dL (ref 0.44–1.00)
GFR calc non Af Amer: >60 mL/min (ref >60)
GLUCOSE: 92 mg/dL (ref 65–99)
POTASSIUM: 3.8 mmol/L (ref 3.5–5.1)
Sodium: 137 mmol/L (ref 135–145)

## 2016-02-15 LAB — CBC WITH DIFFERENTIAL/PLATELET
BASOS ABS: 0 10*3/uL (ref 0.0–0.1)
BASOS PCT: 1 %
Eosinophils Absolute: 0.2 10*3/uL (ref 0.0–0.7)
Eosinophils Relative: 2 %
HEMATOCRIT: 39.7 % (ref 36.0–46.0)
HEMOGLOBIN: 13.2 g/dL (ref 12.0–15.0)
LYMPHS PCT: 43 %
Lymphs Abs: 2.8 10*3/uL (ref 0.7–4.0)
MCH: 30.6 pg (ref 26.0–34.0)
MCHC: 33.2 g/dL (ref 30.0–36.0)
MCV: 91.9 fL (ref 78.0–100.0)
Monocytes Absolute: 0.6 10*3/uL (ref 0.1–1.0)
Monocytes Relative: 9 %
NEUTROS ABS: 2.9 10*3/uL (ref 1.7–7.7)
NEUTROS PCT: 45 %
Platelets: 222 10*3/uL (ref 150–400)
RBC: 4.32 MIL/uL (ref 3.87–5.11)
RDW: 13.1 % (ref 11.5–15.5)
WBC: 6.4 10*3/uL (ref 4.0–10.5)

## 2016-02-15 LAB — PREGNANCY, URINE: Preg Test, Ur: NEGATIVE

## 2016-02-15 LAB — TROPONIN I: Troponin I: <0.03 ng/mL (ref <0.03)

## 2016-02-15 LAB — D-DIMER, QUANTITATIVE (NOT AT ARMC)

## 2016-02-15 MED ORDER — ACETAMINOPHEN 500 MG PO TABS
1000.0000 mg | ORAL_TABLET | Freq: Once | ORAL | Status: AC
  Administered 2016-02-15: 1000 mg via ORAL
  Filled 2016-02-15: qty 2

## 2016-02-15 MED ORDER — KETOROLAC TROMETHAMINE 30 MG/ML IJ SOLN
30.0000 mg | Freq: Once | INTRAMUSCULAR | Status: AC
  Administered 2016-02-15: 30 mg via INTRAVENOUS
  Filled 2016-02-15: qty 1

## 2016-02-15 NOTE — ED Provider Notes (Signed)
TIME SEEN: 12:40 AM  CHIEF COMPLAINT: Chest pain for 2 months  HPI: Patient is a 25 year old female with no significant past medical history who presents the emergency department with chest pain for the past 2 months. Describes it as right-sided, constant that waxes and wanes. Changes with different positions and with deep inspiration. It does not change with palpation. There is nonexertional. Denies any injury to the chest. No fevers or cough. Denies shortness of breath, nausea or vomiting, dizziness or diaphoresis. She is a smoker. No family history of premature CAD.  No history of PE, DVT, exogenous estrogen use, fracture, surgery, trauma, hospitalization, prolonged travel. No lower extremity swelling or pain. No calf tenderness. She did take birth control pills but quit taking them 6 months ago. States she has been trying Tylenol, ibuprofen and Aleve with only minimal relief. No radiation of pain.  ROS: See HPI Constitutional: no fever  Eyes: no drainage  ENT: no runny nose   Cardiovascular:   chest pain  Resp: no SOB  GI: no vomiting GU: no dysuria Integumentary: no rash  Allergy: no hives  Musculoskeletal: no leg swelling  Neurological: no slurred speech ROS otherwise negative  PAST MEDICAL HISTORY/PAST SURGICAL HISTORY:  No past medical history on file.  MEDICATIONS:  Prior to Admission medications   Medication Sig Start Date End Date Taking? Authorizing Provider  amoxicillin (AMOXIL) 500 MG capsule Take 1 capsule (500 mg total) by mouth 3 (three) times daily. 04/06/14   Kathrynn Speedobyn M Hess, PA-C  HYDROcodone-acetaminophen (NORCO/VICODIN) 5-325 MG per tablet Take 1-2 tablets by mouth every 4 (four) hours as needed. 04/06/14   Kathrynn Speedobyn M Hess, PA-C  Prenatal Vit-Fe Fumarate-FA (PRENATAL COMPLETE) 14-0.4 MG TABS Take 1 tablet by mouth daily. 06/01/14   Arby BarretteMarcy Pfeiffer, MD    ALLERGIES:  No Known Allergies  SOCIAL HISTORY:  Social History  Substance Use Topics  . Smoking status: Current  Every Day Smoker    Types: Cigarettes  . Smokeless tobacco: Not on file  . Alcohol use Yes    FAMILY HISTORY: No family history on file.  EXAM: BP 119/74   Pulse 85   Temp 98.1 F (36.7 C) (Oral)   Resp 18   Ht 5\' 6"  (1.676 m)   Wt 160 lb (72.6 kg)   LMP 02/01/2016 (Approximate)   SpO2 99%   BMI 25.82 kg/m  CONSTITUTIONAL: Alert and oriented and responds appropriately to questions. Well-appearing; well-nourished HEAD: Normocephalic EYES: Conjunctivae clear, PERRL, EOMI ENT: normal nose; no rhinorrhea; moist mucous membranes NECK: Supple, no meningismus, no nuchal rigidity, no LAD  CARD: RRR; S1 and S2 appreciated; no murmurs, no clicks, no rubs, no gallops CHEST:  Nontender to palpation without crepitus, ecchymosis or deformity. No rash or other lesions. No breast masses appreciated. RESP: Normal chest excursion without splinting or tachypnea; breath sounds clear and equal bilaterally; no wheezes, no rhonchi, no rales, no hypoxia or respiratory distress, speaking full sentences ABD/GI: Normal bowel sounds; non-distended; soft, non-tender, no rebound, no guarding, no peritoneal signs, no hepatosplenomegaly BACK:  The back appears normal and is non-tender to palpation, there is no CVA tenderness EXT: Normal ROM in all joints; non-tender to palpation; no edema; normal capillary refill; no cyanosis, no calf tenderness or swelling    SKIN: Normal color for age and race; warm; no rash NEURO: Moves all extremities equally, sensation to light touch intact diffusely, cranial nerves II through XII intact, normal speech PSYCH: The patient's mood and manner are appropriate. Grooming and personal  hygiene are appropriate.  MEDICAL DECISION MAKING: Patient here with right-sided chest pain. May be muscular skeletal in nature but she does describe it as pleuritic and was recently on birth control pills and is a smoker. No tachycardia, tachypnea or hypoxia. I feel that she is low enough risk for PE  that we can evaluate her with a d-dimer. EKG shows no new ischemic changes compared to previous, no arrhythmia, no interval change. We'll obtain chest x-ray. We'll treat her pain with IV Toradol.  ED PROGRESS: 2:18 AM  Pt's pain is improved but she is still requesting something else for pain. I suspect this is musculoskeletal in nature. We'll give oral Tylenol. I do not feel narcotics are indicated. Labs are unremarkable. No leukocytosis, anemia, electrolyte abnormality. Troponin negative and d-dimer negative. Chest x-ray clear without white mediastinum, cardiomegaly, pulmonary edema, infiltrate, pneumothorax or rib fracture. Discussed with her that I think that this is musculoskeletal in nature. Urine pregnancy test is pending. Anticipate discharge home. She does have a PCP for follow-up. She is comfortable with this plan.  2:40 AM  Pt's pregnancy test is negative. I feel she is safe to be discharged home with outpatient follow-up. Discussed return precautions. She is comfortable with this plan.  At this time, I do not feel there is any life-threatening condition present. I have reviewed and discussed all results (EKG, imaging, lab, urine as appropriate) and exam findings with patient/family. I have reviewed nursing notes and appropriate previous records.  I feel the patient is safe to be discharged home without further emergent workup and can continue workup as an outpatient as needed. Discussed usual and customary return precautions. Patient/family verbalize understanding and are comfortable with this plan.  Outpatient follow-up has been provided. All questions have been answered.      EKG Interpretation  Date/Time:  Sunday February 15 2016 00:54:25 EST Ventricular Rate:  78 PR Interval:    QRS Duration: 88 QT Interval:  372 QTC Calculation: 424 R Axis:   60 Text Interpretation:  Sinus rhythm No significant change since last tracing Reconfirmed by Brevyn Ring,  DO, Goldia Ligman [phone]) on 02/15/2016  12:59:45 AM         Bonnie MawKristen N Naphtali Riede, DO 02/15/16 [id number]

## 2016-02-15 NOTE — ED Notes (Signed)
ED Provider at bedside. 

## 2016-02-15 NOTE — ED Notes (Signed)
Pt given d/c instructions as per chart. Verbalizes understanding. No questions. 

## 2016-02-15 NOTE — ED Triage Notes (Signed)
Presents with upper chest pain above breasts that began 2 months ago and goes straight through to the back. Pain is worse with deep breathing and certain movements,. Nothing makes pain better. denies SOB and SOB. Pain is described as tightness. Breath sounds clear, + cigarette smoker.

## 2016-02-15 NOTE — ED Notes (Signed)
Patient transported to X-ray 

## 2016-02-15 NOTE — Discharge Instructions (Addendum)
You may alternate between Tylenol 1000 mg every 6 hours as needed for pain and ibuprofen 800 mg every 8 hours as needed for pain. Please take ibuprofen with food. Both of these medications are found over-the-counter.   Your blood tests today were normal including your cardiac labs and a blood test called a d-dimer which rules out blood clots. Your chest x-ray was normal. Your pregnancy test was negative. This is likely musculoskeletal in nature and we recommend following up with your primary care physician if symptoms continue or worsen.   To find a primary care or specialty doctor please call [phone] or [phone] to access "[place] Find a Doctor Service."  You may also go on the [facility] website at [email or web address]  There are also multiple [facility], [facility], [facility] and [facility] practices throughout [place] that are frequently accepting new patients. You may find a clinic that is close to your home and contact them.  [facility] -  [address] [place] [phone]   [facility] -  [address] [place] [id number] [phone]   [facility] [phone]  [place] [phone]

## 2016-02-15 NOTE — ED Notes (Signed)
ED Provider at bedside to discuss results. 

## 2016-05-04 DIAGNOSIS — Z0000 Encounter for general adult medical examination without abnormal findings: Secondary | ICD-10-CM | POA: Diagnosis not present

## 2016-05-04 DIAGNOSIS — Z6826 Body mass index (BMI) 26.0-26.9, adult: Secondary | ICD-10-CM | POA: Diagnosis not present

## 2016-07-15 DIAGNOSIS — O3680X0 Pregnancy with inconclusive fetal viability, not applicable or unspecified: Secondary | ICD-10-CM | POA: Diagnosis not present

## 2016-07-15 DIAGNOSIS — Z3689 Encounter for other specified antenatal screening: Secondary | ICD-10-CM | POA: Diagnosis not present

## 2017-12-22 IMAGING — DX DG CHEST 2V
2 series · 2 of 2 positions shown · non-contrast
Comparison: 11/15/2011

CLINICAL DATA: Right-sided chest pain for 2 months. Pain is worse
with deep breathing.

EXAM:
CHEST  2 VIEW

[chest pa]
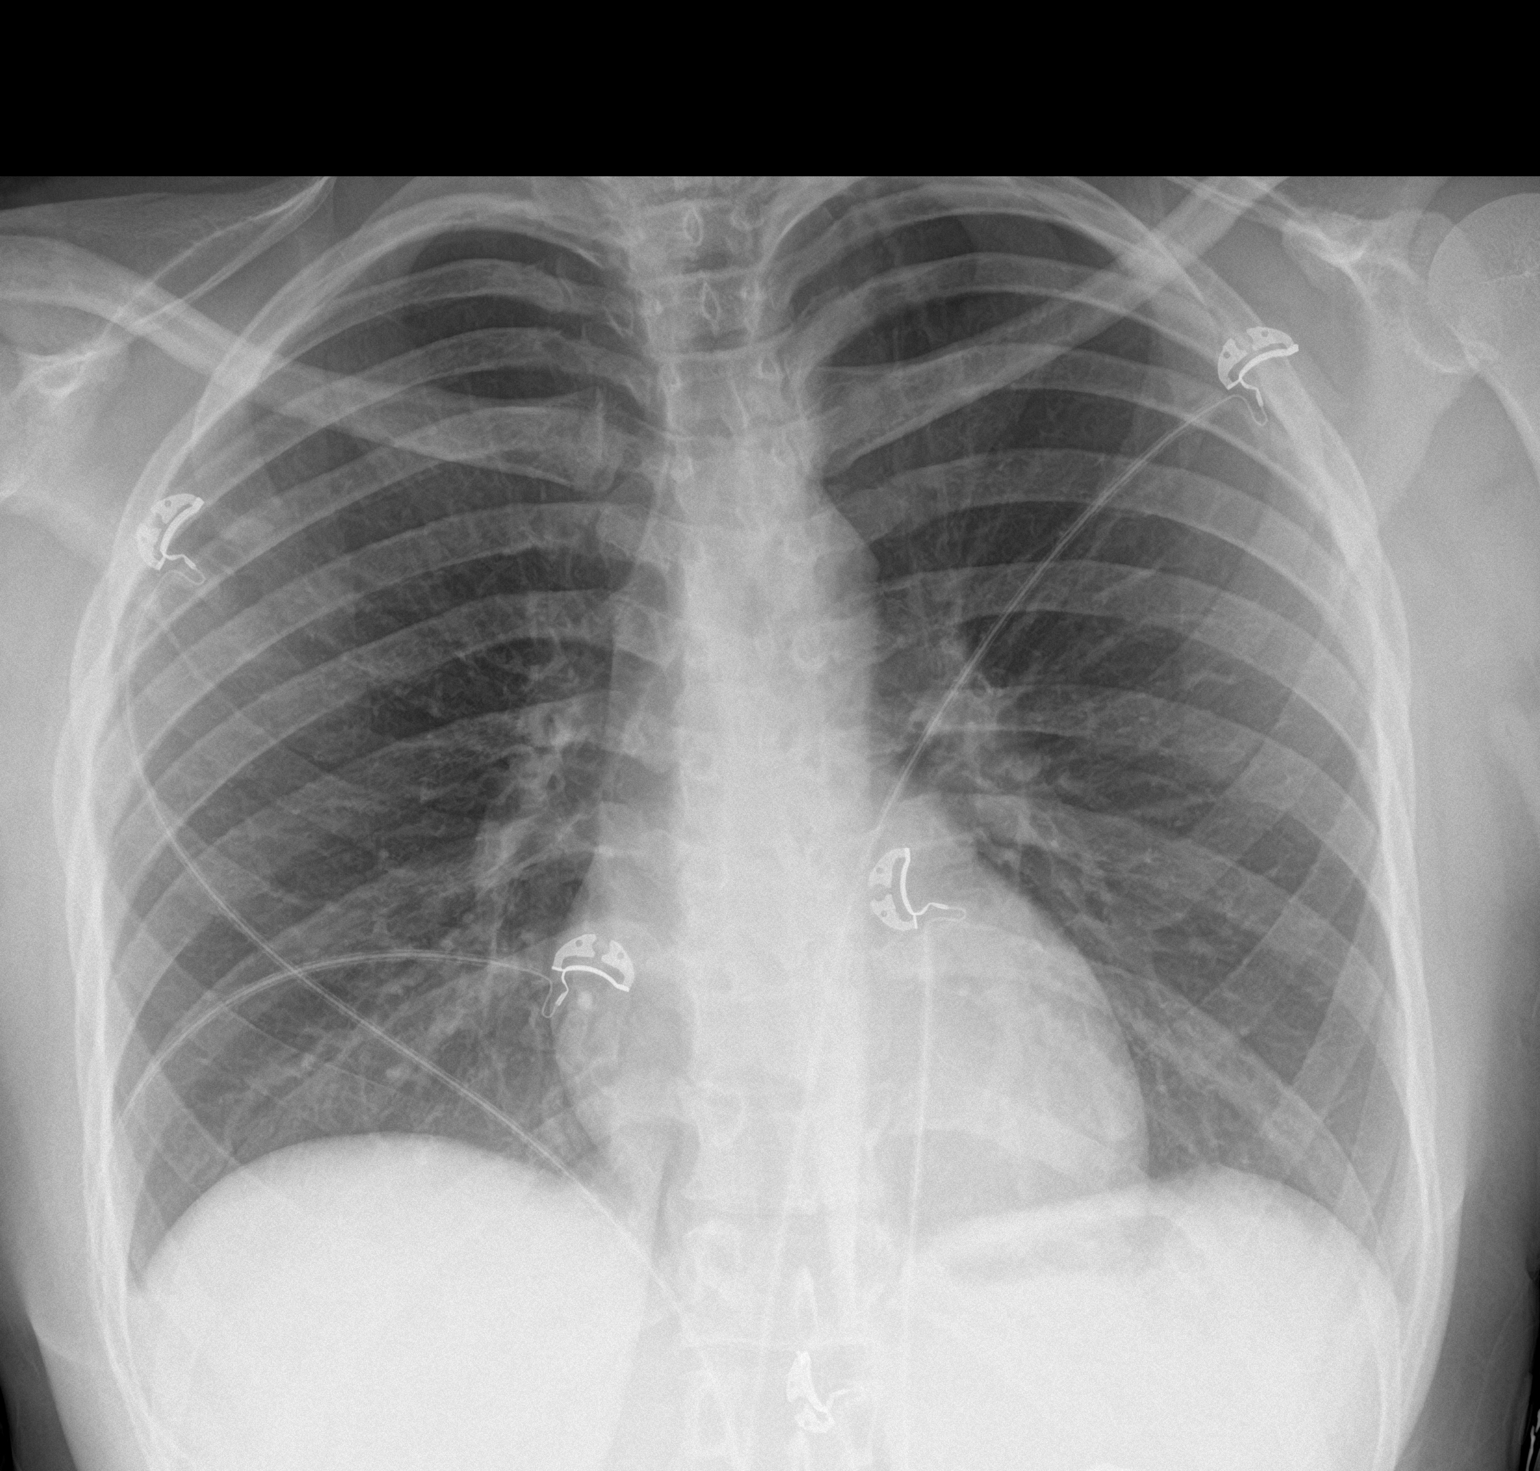

[chest lat]
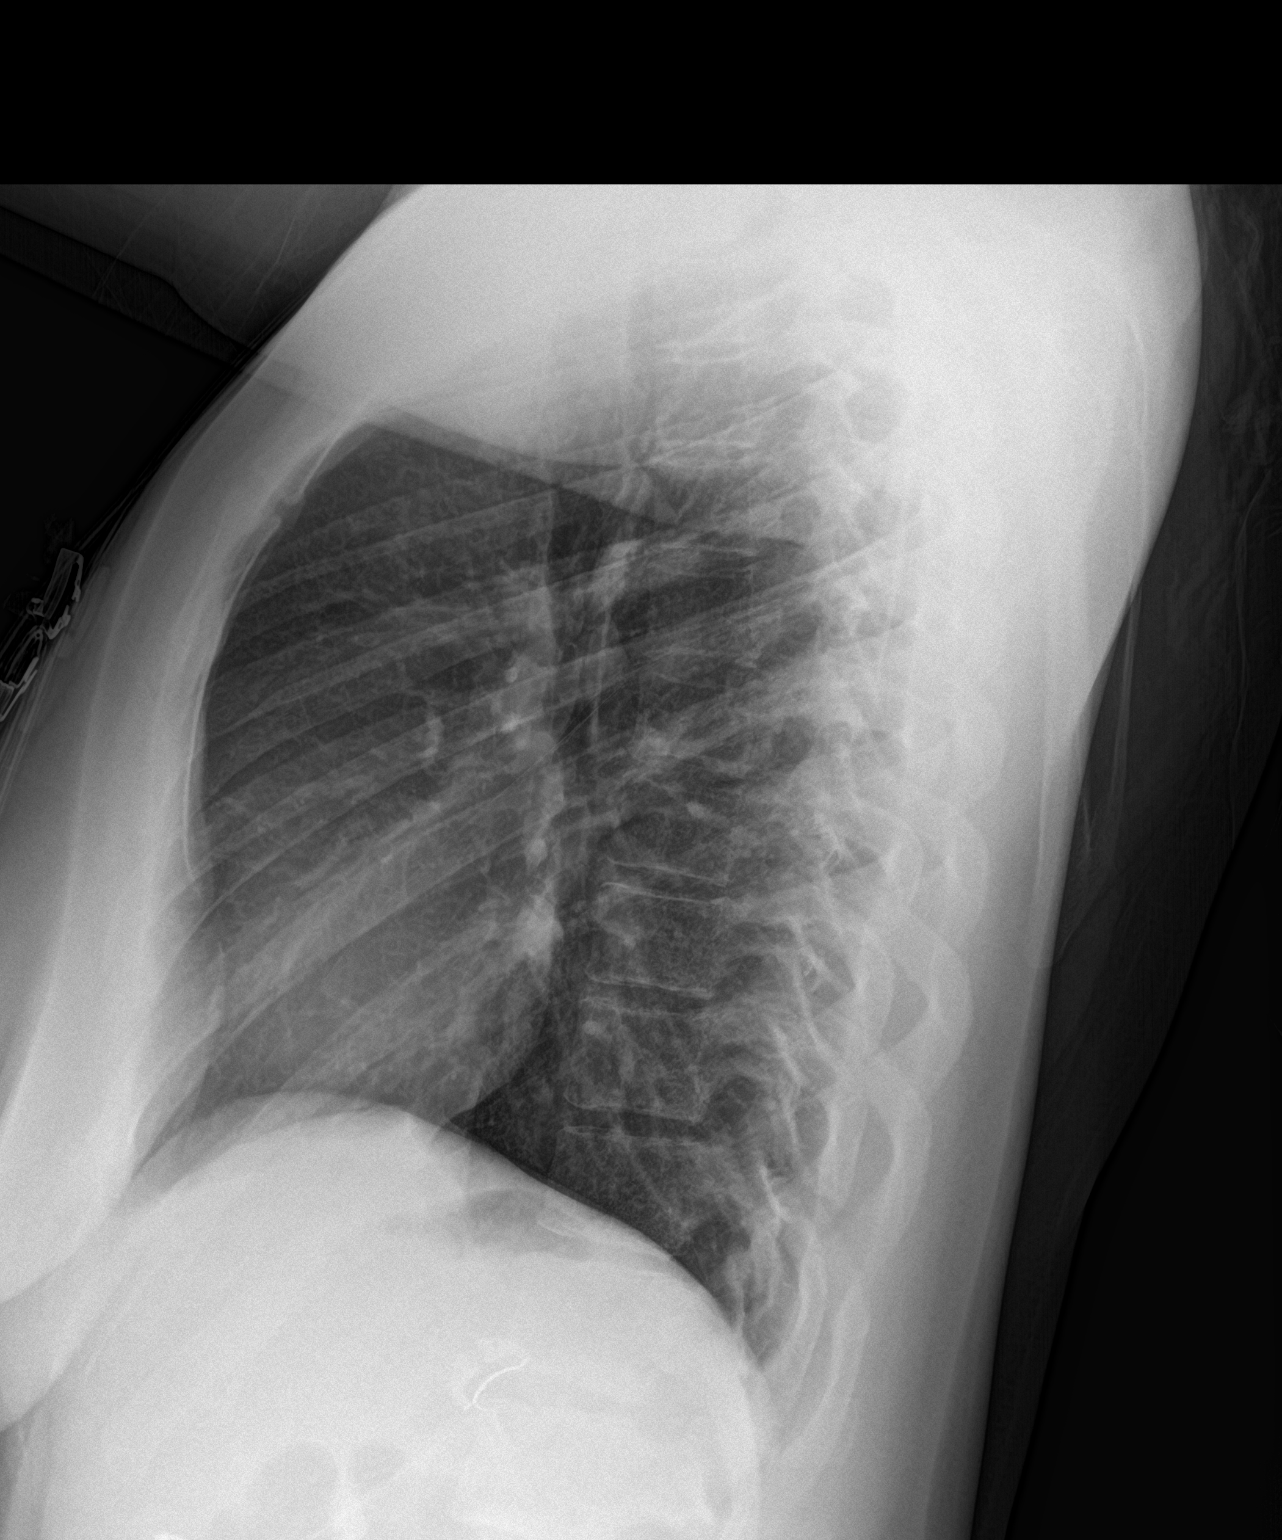

[2 of 2 positions shown; findings below may reference images not displayed]

FINDINGS: The heart size and mediastinal contours are within normal limits.
Both lungs are clear. The visualized skeletal structures are
unremarkable.
IMPRESSION: No active cardiopulmonary disease.

## 2019-09-05 ENCOUNTER — Other Ambulatory Visit: Payer: Self-pay

## 2019-09-05 ENCOUNTER — Emergency Department (HOSPITAL_BASED_OUTPATIENT_CLINIC_OR_DEPARTMENT_OTHER)
Admission: EM | Admit: 2019-09-05 | Discharge: 2019-09-05 | Disposition: A | Discharge status: Home / Self Care | Payer: Medicaid Other | Attending: Emergency Medicine | Admitting: Emergency Medicine

## 2019-09-05 ENCOUNTER — Encounter (HOSPITAL_BASED_OUTPATIENT_CLINIC_OR_DEPARTMENT_OTHER): Payer: Self-pay | Admitting: Emergency Medicine

## 2019-09-05 DIAGNOSIS — Z3490 Encounter for supervision of normal pregnancy, unspecified, unspecified trimester: Secondary | ICD-10-CM

## 2019-09-05 DIAGNOSIS — O99519 Diseases of the respiratory system complicating pregnancy, unspecified trimester: Secondary | ICD-10-CM | POA: Insufficient documentation

## 2019-09-05 DIAGNOSIS — R070 Pain in throat: Secondary | ICD-10-CM | POA: Diagnosis present

## 2019-09-05 DIAGNOSIS — Z3A00 Weeks of gestation of pregnancy not specified: Secondary | ICD-10-CM | POA: Insufficient documentation

## 2019-09-05 DIAGNOSIS — J020 Streptococcal pharyngitis: Secondary | ICD-10-CM | POA: Insufficient documentation

## 2019-09-05 LAB — PREGNANCY, URINE: Preg Test, Ur: POSITIVE — AB

## 2019-09-05 LAB — GROUP A STREP BY PCR: Group A Strep by PCR: DETECTED — AB

## 2019-09-05 MED ORDER — AMOXICILLIN 500 MG PO CAPS
500.0000 mg | ORAL_CAPSULE | Freq: Once | ORAL | Status: AC
  Administered 2019-09-05: 500 mg via ORAL
  Filled 2019-09-05: qty 1

## 2019-09-05 MED ORDER — AMOXICILLIN 500 MG PO CAPS: 500.0000 mg | ORAL_CAPSULE | Freq: Two times a day (BID) | ORAL | 0 refills | Status: AC

## 2019-09-05 NOTE — ED Notes (Signed)
ED Provider at bedside. 

## 2019-09-05 NOTE — Discharge Instructions (Addendum)
At this time there does not appear to be the presence of an emergent medical condition, however there is always the potential for conditions to change. Please read and follow the below instructions.  Please return to the Emergency Department immediately for any new or worsening symptoms. Please be sure to follow up with your Primary Care Provider within one week regarding your visit today; please call their office to schedule an appointment even if you are feeling better for a follow-up visit. Please take your antibiotic Amoxicillin as prescribed until complete to help with your symptoms.  Please drink enough water to avoid dehydration and get plenty of rest. Your pregnancy test today was positive.  Please see your OB/GYN for further pregnancy care.  Call their office tomorrow morning to schedule an appointment. You may use Tylenol as directed on the packaging to help with your symptoms.  Avoid use of NSAIDs such as ibuprofen as these could be harmful to pregnancy.  Get help right away if: You vomit. You have a very bad headache. Your neck hurts or feels stiff. You have chest pain or are short of breath. You have drooling, very bad throat pain, or changes in your voice. Your neck is swollen, or the skin gets red and tender. Your mouth is dry, or you are peeing less than normal. You keep feeling more tired or have trouble waking up. Your joints are red or painful.You have a fever. You are leaking fluid from your vagina. You have spotting or bleeding from your vagina. You have very bad belly cramping or pain. You gain or lose weight rapidly. You throw up blood. It may look like coffee grounds. You are around people who have [place] measles, fifth disease, or chickenpox. You have a very bad headache. You have shortness of breath. You have any kind of trauma, such as from a fall or a car accident. You have any new/concerning or worsening of symptoms  Please read the additional information  packets attached to your discharge summary.  Do not take your medicine if  develop an itchy rash, swelling in your mouth or lips, or difficulty breathing; call 911 and seek immediate emergency medical attention if this occurs.  Note: Portions of this text may have been transcribed using voice recognition software. Every effort was made to ensure accuracy; however, inadvertent computerized transcription errors may still be present.

## 2019-09-05 NOTE — ED Provider Notes (Signed)
[facility] EMERGENCY DEPARTMENT Provider Note   CSN: [id number] Arrival date & time: 09/05/19  1831     History Chief Complaint  Patient presents with   Sore Throat    Bonnie May is a 29 y.o. female presents today for sore throat ongoing for 3 days described as a mild scratchy sensation bilateral nonradiating worsened with swallowing improves with rest.  Reports has felt warm at home has not measured a fever.  Denies headache, vision changes, difficulty swallowing, eye pain, trismus, swelling of face/head/neck, abdominal pain, nausea/vomiting or any additional concerns.  HPI     History reviewed. No pertinent past medical history.  There are no problems to display for this patient.   History reviewed. No pertinent surgical history.   OB History   No obstetric history on file.     History reviewed. No pertinent family history.  Social History   Tobacco Use   Smoking status: Current Every Day Smoker    Types: Cigarettes  Substance Use Topics   Alcohol use: Yes   Drug use: No    Home Medications Prior to Admission medications   Medication Sig Start Date End Date Taking? Authorizing Provider  amoxicillin (AMOXIL) 500 MG capsule Take 1 capsule (500 mg total) by mouth 2 (two) times daily for 10 days. 09/05/19 09/15/19  Bill Salinas, PA-C    Allergies    Patient has no known allergies.  Review of Systems   Review of Systems  Constitutional: Negative for chills and fever.  HENT: Positive for sore throat. Negative for ear pain and trouble swallowing.   Eyes: Negative.  Negative for pain.  Gastrointestinal: Negative.  Negative for abdominal pain, nausea and vomiting.  Musculoskeletal: Negative.  Negative for neck pain.  Neurological: Negative.  Negative for headaches.    Physical Exam Updated Vital Signs BP 110/73 (BP Location: Right Arm)    Pulse 92    Temp 99.6 F (37.6 C) (Oral)    Resp 19    Ht 5\' 6"  (1.676 m)    Wt 99.2 kg     SpO2 100%    BMI 35.30 kg/m   Physical Exam Constitutional:      General: She is not in acute distress.    Appearance: Normal appearance. She is well-developed. She is not ill-appearing or diaphoretic.  HENT:     Head: Normocephalic and atraumatic.     Jaw: There is normal jaw occlusion.     Right Ear: Tympanic membrane and external ear normal.     Left Ear: Tympanic membrane and external ear normal.     Nose: Nose normal.     Mouth/Throat:     Comments: The patient has normal phonation and is in control of secretions. No stridor.  Midline uvula without edema. Soft palate rises symmetrically.  Mild tonsillar erythema, small amount of exudate, minimal swelling bilaterally symmetric. Tongue protrusion is normal, floor of mouth is soft. No trismus. No creptius on neck palpation. No gingival erythema or fluctuance noted. Mucus membranes moist. No pallor noted. Eyes:     General: Vision grossly intact. Gaze aligned appropriately.     Pupils: Pupils are equal, round, and reactive to light.  Neck:     Trachea: Trachea and phonation normal.  Pulmonary:     Effort: Pulmonary effort is normal. No respiratory distress.  Abdominal:     General: There is no distension.     Palpations: Abdomen is soft.     Tenderness: There is no abdominal tenderness.  There is no guarding or rebound.  Musculoskeletal:        General: Normal range of motion.     Cervical back: Normal range of motion.  Skin:    General: Skin is warm and dry.  Neurological:     Mental Status: She is alert.     GCS: GCS eye subscore is 4. GCS verbal subscore is 5. GCS motor subscore is 6.     Comments: Speech is clear and goal oriented, follows commands Major Cranial nerves without deficit, no facial droop Moves extremities without ataxia, coordination intact  Psychiatric:        Behavior: Behavior normal.     ED Results / Procedures / Treatments   Labs (all labs ordered are listed, but only abnormal results are  displayed) Labs Reviewed  GROUP A STREP BY PCR - Abnormal; Notable for the following components:      Result Value   Group A Strep by PCR DETECTED (*)    All other components within normal limits  PREGNANCY, URINE - Abnormal; Notable for the following components:   Preg Test, Ur POSITIVE (*)    All other components within normal limits    EKG None  Radiology No results found.  Procedures Procedures (including critical care time)  Medications Ordered in ED Medications  amoxicillin (AMOXIL) capsule 500 mg (500 mg Oral Given 09/05/19 2212)    ED Course  I have reviewed the triage vital signs and the nursing notes.  Pertinent labs & imaging results that were available during my care of the patient were reviewed by me and considered in my medical decision making (see chart for details).    MDM Rules/Calculators/A&P                          Additional History Obtained: 1. Nursing notes from this visit. - 30-year-old female presents today for sore throat for 3 days.  Well-appearing no acute distress.  Care nerves intact.  TMs clear.  No pain with extraocular motion.  No facial swelling.  She has mild erythema and exudate of bilateral tonsils.  Airway is intact.  No evidence of PTA, RPA, Ludwig's or other deep space infections.  No meningismus.  No other complaints.  Will obtain strep test and pregnancy test. - Pregnancy test positive.  Patient was unaware that she is pregnant reports that she was due to start her period in the last few days.  She reports she has an OB/GYN to follow-up with.  I encouraged her to call the OB/GYN tomorrow morning to schedule a follow-up visit.  She has no pregnancy related concerns.  Strep test positive.  Confirmed with Corene Cornea pharmacist at [facility] that amoxicillin safe in for trimester.  Patient started on amoxicillin 500 mg twice daily x10 days.  Encouraged to follow-up with her PCP.  At this time there does not appear to be any evidence of an acute  emergency medical condition and the patient appears stable for discharge with appropriate outpatient follow up. Diagnosis was discussed with patient who verbalizes understanding of care plan and is agreeable to discharge. I have discussed return precautions with patient who verbalizes understanding. Patient encouraged to follow-up with their PCP and OBGYN. All questions answered.  Note: Portions of this report may have been transcribed using voice recognition software. Every effort was made to ensure accuracy; however, inadvertent computerized transcription errors may still be present. Final Clinical Impression(s) / ED Diagnoses Final diagnoses:  Strep pharyngitis  Pregnancy, unspecified gestational age    Rx / DC Orders ED Discharge Orders         Ordered    amoxicillin (AMOXIL) 500 MG capsule  2 times daily     Discontinue  Reprint     09/05/19 2219           Bill Salinas, PA-C 09/05/19 2236    Tegeler, Canary Brim, MD 09/05/19 [phone]

## 2019-09-05 NOTE — ED Triage Notes (Signed)
Pt here with red and painful sore throat since Monday. Fever at home and takes tylenol to help.

## 2022-08-24 ENCOUNTER — Other Ambulatory Visit: Payer: Self-pay

## 2022-08-24 DIAGNOSIS — N390 Urinary tract infection, site not specified: Secondary | ICD-10-CM | POA: Insufficient documentation

## 2022-08-24 DIAGNOSIS — R300 Dysuria: Secondary | ICD-10-CM | POA: Diagnosis present

## 2022-08-25 ENCOUNTER — Emergency Department (HOSPITAL_BASED_OUTPATIENT_CLINIC_OR_DEPARTMENT_OTHER)
Admission: EM | Admit: 2022-08-25 | Discharge: 2022-08-25 | Disposition: A | Discharge status: Home / Self Care | Payer: Medicaid Other | Attending: Emergency Medicine | Admitting: Emergency Medicine

## 2022-08-25 ENCOUNTER — Other Ambulatory Visit: Payer: Self-pay

## 2022-08-25 ENCOUNTER — Encounter (HOSPITAL_BASED_OUTPATIENT_CLINIC_OR_DEPARTMENT_OTHER): Payer: Self-pay | Admitting: Emergency Medicine

## 2022-08-25 DIAGNOSIS — N390 Urinary tract infection, site not specified: Secondary | ICD-10-CM

## 2022-08-25 LAB — URINALYSIS, MICROSCOPIC (REFLEX): WBC, UA: >50 WBC/hpf (ref 0–5)

## 2022-08-25 LAB — PREGNANCY, URINE: Preg Test, Ur: NEGATIVE

## 2022-08-25 LAB — URINALYSIS, ROUTINE W REFLEX MICROSCOPIC
Bilirubin Urine: NEGATIVE
Glucose, UA: NEGATIVE mg/dL
Ketones, ur: NEGATIVE mg/dL
Nitrite: POSITIVE — AB
Protein, ur: 100 mg/dL — AB
Specific Gravity, Urine: 1.02 (ref 1.005–1.030)
pH: 6 (ref 5.0–8.0)

## 2022-08-25 MED ORDER — PHENAZOPYRIDINE HCL 100 MG PO TABS
200.0000 mg | ORAL_TABLET | Freq: Once | ORAL | Status: AC
  Administered 2022-08-25: 200 mg via ORAL
  Filled 2022-08-25: qty 2

## 2022-08-25 MED ORDER — PHENAZOPYRIDINE HCL 200 MG PO TABS: 200.0000 mg | ORAL_TABLET | Freq: Three times a day (TID) | ORAL | 0 refills | Status: AC

## 2022-08-25 MED ORDER — CEPHALEXIN 500 MG PO CAPS: 500.0000 mg | ORAL_CAPSULE | Freq: Two times a day (BID) | ORAL | 0 refills | Status: AC

## 2022-08-25 MED ORDER — CEPHALEXIN 250 MG PO CAPS
500.0000 mg | ORAL_CAPSULE | Freq: Once | ORAL | Status: AC
  Administered 2022-08-25: 500 mg via ORAL
  Filled 2022-08-25: qty 2

## 2022-08-25 NOTE — ED Triage Notes (Signed)
Pt reports she started having dysuria yesterday, states urine is cloudy and has a strong odor, increased frequency, denies hematuria, hx of UTIs w similar sxs

## 2022-08-25 NOTE — ED Provider Notes (Signed)
[place] [facility]ERGENCY DEPARTMENT AT [facility] Provider Note   CSN: [id number] Arrival date & time: 08/24/22  2354     History  Chief Complaint  Patient presents with   Dysuria    Bonnie May is a 32 y.o. female.   Dysuria Pain quality:  Burning Pain severity:  Moderate Onset quality:  Gradual Duration:  1 day Timing:  Constant Progression:  Unchanged Chronicity:  Recurrent Recent urinary tract infections: no (2-3 years)   Relieved by:  Nothing Ineffective treatments:  None tried Urinary symptoms: discolored urine and frequent urination   Associated symptoms: no abdominal pain, no fever and no flank pain        Home Medications Prior to Admission medications   Medication Sig Start Date End Date Taking? Authorizing Provider  cephALEXin (KEFLEX) 500 MG capsule Take 1 capsule (500 mg total) by mouth 2 (two) times daily. 08/25/22  Yes Shanon Becvar, MD  phenazopyridine (PYRIDIUM) 200 MG tablet Take 1 tablet (200 mg total) by mouth 3 (three) times daily. 08/25/22  Yes Cleburn Maiolo, MD      Allergies    Nickel    Review of Systems   Review of Systems  Constitutional:  Negative for fever.  HENT:  Negative for facial swelling.   Eyes:  Negative for redness.  Respiratory:  Negative for wheezing and stridor.   Gastrointestinal:  Negative for abdominal pain.  Genitourinary:  Positive for dysuria and frequency. Negative for flank pain.  All other systems reviewed and are negative.   Physical Exam Updated Vital Signs BP 127/81 (BP Location: Right Arm)   Pulse 78   Temp 98.2 F (36.8 C) (Oral)   Resp 18   Ht 5\' 6"  (1.676 m)   Wt 99.8 kg   LMP 07/07/2022 (Approximate) Comment: PCOS, periods are irregular  SpO2 99%   BMI 35.51 kg/m  Physical Exam Vitals and nursing note reviewed.  Constitutional:      General: She is not in acute distress.    Appearance: Normal appearance. She is well-developed.  HENT:     Head: Normocephalic and atraumatic.      Nose: Nose normal.  Eyes:     Pupils: Pupils are equal, round, and reactive to light.  Cardiovascular:     Rate and Rhythm: Normal rate and regular rhythm.     Pulses: Normal pulses.     Heart sounds: Normal heart sounds.  Pulmonary:     Effort: Pulmonary effort is normal. No respiratory distress.     Breath sounds: Normal breath sounds.  Abdominal:     General: Bowel sounds are normal. There is no distension.     Palpations: Abdomen is soft.     Tenderness: There is no abdominal tenderness. There is no guarding or rebound.  Genitourinary:    Vagina: No vaginal discharge.  Musculoskeletal:        General: Normal range of motion.     Cervical back: Neck supple.  Skin:    General: Skin is warm and dry.     Capillary Refill: Capillary refill takes less than 2 seconds.     Findings: No erythema or rash.  Neurological:     General: No focal deficit present.     Mental Status: She is alert and oriented to person, place, and time.     Deep Tendon Reflexes: Reflexes normal.  Psychiatric:        Mood and Affect: Mood normal.     ED Results / Procedures / Treatments  Labs (all labs ordered are listed, but only abnormal results are displayed) Results for orders placed or performed during the hospital encounter of 08/25/22  Urinalysis, Routine w reflex microscopic -Urine, Clean Catch  Result Value Ref Range   Color, Urine YELLOW YELLOW   APPearance TURBID (A) CLEAR   Specific Gravity, Urine 1.020 1.005 - 1.030   pH 6.0 5.0 - 8.0   Glucose, UA NEGATIVE NEGATIVE mg/dL   Hgb urine dipstick MODERATE (A) NEGATIVE   Bilirubin Urine NEGATIVE NEGATIVE   Ketones, ur NEGATIVE NEGATIVE mg/dL   Protein, ur 981 (A) NEGATIVE mg/dL   Nitrite POSITIVE (A) NEGATIVE   Leukocytes,Ua MODERATE (A) NEGATIVE  Pregnancy, urine  Result Value Ref Range   Preg Test, Ur NEGATIVE NEGATIVE  Urinalysis, Microscopic (reflex)  Result Value Ref Range   RBC / HPF 0-5 0 - 5 RBC/hpf   WBC, UA >50 0 - 5  WBC/hpf   Bacteria, UA MANY (A) NONE SEEN   Squamous Epithelial / HPF 0-5 0 - 5 /HPF   WBC Clumps PRESENT    No results found.   Radiology No results found.  Procedures Procedures    Medications Ordered in ED Medications  cephALEXin (KEFLEX) capsule 500 mg (has no administration in time range)  phenazopyridine (PYRIDIUM) tablet 200 mg (has no administration in time range)    ED Course/ Medical Decision Making/ A&P                             Medical Decision Making Patient with dysuria and frequency and discolored urine   Amount and/or Complexity of Data Reviewed External Data Reviewed: notes.    Details: Previous notes reviewed  Labs: ordered.    Details: Urine is consistent with UTI, pregnancy is negative   Risk Prescription drug management. Risk Details: Symptoms of uncomplicated UTI.  Will treat for same.  Pyridium for pain.  Stable for discharge with close follow up.  Strict return   Final Clinical Impression(s) / ED Diagnoses Final diagnoses:  Lower urinary tract infectious disease   Return for intractable cough, coughing up blood, fevers > 100.4 unrelieved by medication, shortness of breath, intractable vomiting, chest pain, shortness of breath, weakness, numbness, changes in speech, facial asymmetry, abdominal pain, passing out, Inability to tolerate liquids or food, cough, altered mental status or any concerns. No signs of systemic illness or infection. The patient is nontoxic-appearing on exam and vital signs are within normal limits.  I have reviewed the triage vital signs and the nursing notes. Pertinent labs & imaging results that were available during my care of the patient were reviewed by me and considered in my medical decision making (see chart for details). After history, exam, and medical workup I feel the patient has been appropriately medically screened and is safe for discharge home. Pertinent diagnoses were discussed with the patient. Patient was  given return precautions Rx / DC Orders ED Discharge Orders          Ordered    cephALEXin (KEFLEX) 500 MG capsule  2 times daily        08/25/22 0037    phenazopyridine (PYRIDIUM) 200 MG tablet  3 times daily        08/25/22 0037              Lataisha Colan, MD 08/25/22 0041

## 2022-09-03 ENCOUNTER — Other Ambulatory Visit: Payer: Self-pay

## 2022-09-03 ENCOUNTER — Emergency Department (HOSPITAL_BASED_OUTPATIENT_CLINIC_OR_DEPARTMENT_OTHER)
Admission: EM | Admit: 2022-09-03 | Discharge: 2022-09-03 | Disposition: A | Discharge status: Home / Self Care | Payer: Medicaid Other | Attending: Emergency Medicine | Admitting: Emergency Medicine

## 2022-09-03 ENCOUNTER — Ambulatory Visit: Payer: Self-pay | Admitting: *Deleted

## 2022-09-03 ENCOUNTER — Encounter (HOSPITAL_BASED_OUTPATIENT_CLINIC_OR_DEPARTMENT_OTHER): Payer: Self-pay | Admitting: Radiology

## 2022-09-03 ENCOUNTER — Emergency Department (HOSPITAL_BASED_OUTPATIENT_CLINIC_OR_DEPARTMENT_OTHER): Payer: Medicaid Other

## 2022-09-03 DIAGNOSIS — R310 Gross hematuria: Secondary | ICD-10-CM | POA: Diagnosis not present

## 2022-09-03 DIAGNOSIS — R319 Hematuria, unspecified: Secondary | ICD-10-CM | POA: Diagnosis present

## 2022-09-03 LAB — URINALYSIS, ROUTINE W REFLEX MICROSCOPIC

## 2022-09-03 LAB — BASIC METABOLIC PANEL
Anion gap: 6 (ref 5–15)
BUN: 12 mg/dL (ref 6–20)
CO2: 27 mmol/L (ref 22–32)
Calcium: 8.3 mg/dL — ABNORMAL LOW (ref 8.9–10.3)
Chloride: 105 mmol/L (ref 98–111)
Creatinine, Ser: 0.76 mg/dL (ref 0.44–1.00)
GFR, Estimated: >60 mL/min (ref >60)
Glucose, Bld: 102 mg/dL — ABNORMAL HIGH (ref 70–99)
Potassium: 3.6 mmol/L (ref 3.5–5.1)
Sodium: 138 mmol/L (ref 135–145)

## 2022-09-03 LAB — PREGNANCY, URINE: Preg Test, Ur: NEGATIVE

## 2022-09-03 LAB — CBC WITH DIFFERENTIAL/PLATELET
Abs Immature Granulocytes: 0.01 10*3/uL (ref 0.00–0.07)
Basophils Absolute: 0 10*3/uL (ref 0.0–0.1)
Basophils Relative: 1 %
Eosinophils Absolute: 0.1 10*3/uL (ref 0.0–0.5)
Eosinophils Relative: 2 %
HCT: 40.1 % (ref 36.0–46.0)
Hemoglobin: 13.2 g/dL (ref 12.0–15.0)
Immature Granulocytes: 0 %
Lymphocytes Relative: 34 %
Lymphs Abs: 1.9 10*3/uL (ref 0.7–4.0)
MCH: 30.8 pg (ref 26.0–34.0)
MCHC: 32.9 g/dL (ref 30.0–36.0)
MCV: 93.5 fL (ref 80.0–100.0)
Monocytes Absolute: 0.6 10*3/uL (ref 0.1–1.0)
Monocytes Relative: 11 %
Neutro Abs: 2.9 10*3/uL (ref 1.7–7.7)
Neutrophils Relative %: 52 %
Platelets: 240 10*3/uL (ref 150–400)
RBC: 4.29 MIL/uL (ref 3.87–5.11)
RDW: 13.5 % (ref 11.5–15.5)
WBC: 5.6 10*3/uL (ref 4.0–10.5)
nRBC: 0 % (ref 0.0–0.2)

## 2022-09-03 LAB — URINALYSIS, MICROSCOPIC (REFLEX): RBC / HPF: >50 RBC/hpf (ref 0–5)

## 2022-09-03 MED ORDER — IOHEXOL 300 MG/ML  SOLN
100.0000 mL | Freq: Once | INTRAMUSCULAR | Status: AC | PRN
  Administered 2022-09-03: 100 mL via INTRAVENOUS

## 2022-09-03 MED ORDER — CIPROFLOXACIN HCL 500 MG PO TABS: 500.0000 mg | ORAL_TABLET | Freq: Two times a day (BID) | ORAL | 0 refills | Status: AC

## 2022-09-03 NOTE — Discharge Instructions (Signed)
Thank you for coming to [facility] Emergency Department. You were seen for bloody urine. We did an exam, labs, and imaging, and these showed low urine with possible infection.  We took cultures of the urine which will result in a couple of days.  You can follow-up this result on your MyChart.  We will treat for possible continued urinary tract infection including pyelonephritis (kidney infection) since you have mentioned back pain.  Please take ciprofloxacin 500 mg twice per day for 7 days.  Please follow up with a urologist within 1 week. You can call them today to make an appointment.    Do not hesitate to return to the ED or call 911 if you experience: -Worsening symptoms -Severe abdominal or back pain -Lightheadedness, passing out -Fevers/chills -Anything else that concerns you

## 2022-09-03 NOTE — ED Triage Notes (Signed)
Pt complains of dark urine that started last night associated with left side abdominal discomfort and lower back pain. Noticed bloody urine late last night.

## 2022-09-03 NOTE — ED Provider Notes (Signed)
[place] [facility] Provider Note   CSN: [id number] Arrival date & time: 09/03/22  1049     History  Chief Complaint  Patient presents with   Hematuria    Bonnie May is a 32 y.o. female with ADHD, MDD who presents with dark urine that started last night associated with left side abdominal discomfort and lower back pain. Noticed bloody urine late last night.   Per chart review, patient presented to ED on 08/25/22 for discolored urine/burning/frequency. Denied flank pain. Had +UTI at that time and given keflex and pyridium rx.  She states her symptoms much improved after the Keflex and she finished the course.  She felt well until late last night and early this morning when she had an episode of hematuria.  This associated with midline lower back pain and left lower quadrant abdominal "discomfort."  She is continue to have hematuria and dark urine since that time.  There is no burning with urination, frequency of urination that she had before with her UTI symptoms.  She is never had a UTI before or had symptoms like this before.  Denies any other fevers or chills.  She is not having any vaginal bleeding or blood in her stool.  HPI     Home Medications Prior to Admission medications   Medication Sig Start Date End Date Taking? Authorizing Provider  ciprofloxacin (CIPRO) 500 MG tablet Take 1 tablet (500 mg total) by mouth every 12 (twelve) hours for 7 days. 09/03/22 09/10/22 Yes Loetta Rough, MD  cephALEXin (KEFLEX) 500 MG capsule Take 1 capsule (500 mg total) by mouth 2 (two) times daily. Patient not taking: Reported on 09/03/2022 08/25/22   Palumbo, April, MD  phenazopyridine (PYRIDIUM) 200 MG tablet Take 1 tablet (200 mg total) by mouth 3 (three) times daily. Patient not taking: Reported on 09/03/2022 08/25/22   Palumbo, April, MD      Allergies    Nickel    Review of Systems   Review of Systems A 10 point review of systems was performed and  is negative unless otherwise reported in HPI.  Physical Exam Updated Vital Signs BP 107/87   Pulse 72   Temp 98.8 F (37.1 C) (Oral)   Resp 18   Ht 5\' 6"  (1.676 m)   Wt 101.6 kg   LMP 07/07/2022 (Approximate) Comment: neg upreg in er 09/03/22. pt has pcos.  SpO2 99%   BMI 36.15 kg/m  Physical Exam General: Normal appearing female, lying in bed.  HEENT: PERRLA, Sclera anicteric, MMM, trachea midline.  Cardiology: RRR, no murmurs/rubs/gallops. BL radial and DP pulses equal bilaterally.  Resp: Normal respiratory rate and effort. CTAB, no wheezes, rhonchi, crackles.  Abd: Soft, non-tender, non-distended. No rebound tenderness or guarding.  GU: Deferred. MSK: No peripheral edema or signs of trauma. Extremities without deformity or TTP. No cyanosis or clubbing. Skin: warm, dry. No rashes or lesions. Back: No CVA tenderness Neuro: A&Ox4, CNs II-XII grossly intact. MAEs. Sensation grossly intact.  Psych: Normal mood and affect.   ED Results / Procedures / Treatments   Labs (all labs ordered are listed, but only abnormal results are displayed) Labs Reviewed  URINALYSIS, ROUTINE W REFLEX MICROSCOPIC - Abnormal; Notable for the following components:      Result Value   Color, Urine RED (*)    APPearance TURBID (*)    Glucose, UA   (*)    Value: TEST NOT REPORTED DUE TO COLOR INTERFERENCE OF URINE PIGMENT  Hgb urine dipstick   (*)    Value: TEST NOT REPORTED DUE TO COLOR INTERFERENCE OF URINE PIGMENT   Bilirubin Urine   (*)    Value: TEST NOT REPORTED DUE TO COLOR INTERFERENCE OF URINE PIGMENT   Ketones, ur   (*)    Value: TEST NOT REPORTED DUE TO COLOR INTERFERENCE OF URINE PIGMENT   Protein, ur   (*)    Value: TEST NOT REPORTED DUE TO COLOR INTERFERENCE OF URINE PIGMENT   Nitrite   (*)    Value: TEST NOT REPORTED DUE TO COLOR INTERFERENCE OF URINE PIGMENT   Leukocytes,Ua   (*)    Value: TEST NOT REPORTED DUE TO COLOR INTERFERENCE OF URINE PIGMENT   All other components within  normal limits  URINALYSIS, MICROSCOPIC (REFLEX) - Abnormal; Notable for the following components:   Bacteria, UA FEW (*)    All other components within normal limits  BASIC METABOLIC PANEL - Abnormal; Notable for the following components:   Glucose, Bld 102 (*)    Calcium 8.3 (*)    All other components within normal limits  URINE CULTURE  PREGNANCY, URINE  CBC WITH DIFFERENTIAL/PLATELET  GC/CHLAMYDIA PROBE AMP ([place]) NOT AT [facility]    EKG None  Radiology CT ABDOMEN PELVIS W CONTRAST  Result Date: 09/03/2022 CLINICAL DATA:  Urinary tract infection.  Abdominal pain. EXAM: CT ABDOMEN AND PELVIS WITH CONTRAST TECHNIQUE: Multidetector CT imaging of the abdomen and pelvis was performed using the standard protocol following bolus administration of intravenous contrast. RADIATION DOSE REDUCTION: This exam was performed according to the departmental dose-optimization program which includes automated exposure control, adjustment of the mA and/or kV according to patient size and/or use of iterative reconstruction technique. CONTRAST:  OMNIPAQUE IOHEXOL 300 MG/ML  SOLN COMPARISON:  None Available. FINDINGS: Lower chest: Normal heart size. Dependent atelectasis left lower lobe. No pleural effusion. Hepatobiliary: The liver is normal in size and contour. Fatty deposition adjacent to the falciform ligament. Gallbladder is unremarkable. No intrahepatic or extrahepatic biliary ductal dilatation. Pancreas: Unremarkable Spleen: Unremarkable Adrenals/Urinary Tract: Normal adrenal glands. Kidneys enhance symmetrically with contrast. No hydronephrosis. Urinary bladder is unremarkable. Stomach/Bowel: No abnormal bowel wall thickening or evidence for bowel obstruction. No free fluid or free intraperitoneal air. Normal appendix. Normal morphology of the stomach. Vascular/Lymphatic: Normal caliber abdominal aorta. No retroperitoneal lymphadenopathy. Reproductive: Retroverted uterus. Adnexal structures  unremarkable. No pelvic masses. Other: None. Musculoskeletal: No aggressive or acute appearing osseous lesions. IMPRESSION: No acute process in the abdomen or pelvis. Electronically Signed   By: Annia Belt M.D.   On: 09/03/2022 13:03    Procedures Procedures    Medications Ordered in ED Medications  iohexol (OMNIPAQUE) 300 MG/ML solution 100 mL (100 mLs Intravenous Contrast Given 09/03/22 1246)    ED Course/ Medical Decision Making/ A&P                          Medical Decision Making Amount and/or Complexity of Data Reviewed Labs: ordered. Decision-making details documented in ED Course. Radiology: ordered.  Risk Prescription drug management.    This patient presents to the ED for concern of hematuria, this involves an extensive number of treatment options, and is a complaint that carries with it a high risk of complications and morbidity.  I considered the following differential and admission for this acute, potentially life threatening condition.   MDM:    Consider ongoing UTI/failure of o/p antibiotic treatment or pyelonephritis though her symptoms  have improved. UA w/ today w/ gross hematuria. Added on urine culture today.  Consider ureterolithiasis/nephrolithiasis though pain seems minimal w/ no CVA TTP, no abdominal TTP. Consider G/C urethritis though no symptoms of urethritis, will test urine for G/C as well. No h/o coagulopathy, no pharmacologic anticoagulation. Consider glomerular causes like glomerulonephritis, lupus, or serum sickness from recent antibiotics. Consider non-glomerular causes such as papillary necrosis, pyelonephritis, or renal abscess from recent UTI though she finished her abx. Will obtain CT pelvis and labs to further eval.    Clinical Course as of 09/03/22 1325  Fri Sep 03, 2022  1119 Preg Test, Ur: NEGATIVE [HN]  1119 Urinalysis, Routine w reflex microscopic -Urine, Clean Catch(!) +gross hematuria with a few bacteria. G/C and culture sent as well.  [HN]  1233 Basic metabolic panel(!) Unremarkable in the context of this patient's presentation  [HN]  1234 WBC: 5.6 No leukocytosis [HN]  1235 Hemoglobin: 13.2 No anemia [HN]  1321 Patient with very reassuring CT abdomen pelvis. With lower back pain and gross hematuria with bacteria in the urine we will treat for pyelonephritis with ciprofloxacin PO BID x 7 days.  Patient is vitally stable and afebrile, no concern for sepsis/SIRS at this time. Patient and her boyfriend are informed that G/C test and urine culture will result as well. But with gross hematuria I recommend she f/u with urology as she may need further w/u such as cystoscopy. She is given urology info and instructed to call to make an appointment. Will be DC'd w/ DC instructions/return precautions. [HN]    Clinical Course User Index [HN] Loetta Rough, MD    Labs: I Ordered, and personally interpreted labs.  The pertinent results include:  those listed above  Imaging Studies ordered: I ordered imaging studies including CT abd pelvis w contrast I independently visualized and interpreted imaging. I agree with the radiologist interpretation  Additional history obtained from chart review.    Reevaluation: After the interventions noted above, I reevaluated the patient and found that they have :stayed the same  Social Determinants of Health: Patient lives independently   Disposition:  DC  Co morbidities that complicate the patient evaluation History reviewed. No pertinent past medical history.   Medicines Meds ordered this encounter  Medications   iohexol (OMNIPAQUE) 300 MG/ML solution 100 mL   ciprofloxacin (CIPRO) 500 MG tablet    Sig: Take 1 tablet (500 mg total) by mouth every 12 (twelve) hours for 7 days.    Dispense:  14 tablet    Refill:  0    I have reviewed the patients home medicines and have made adjustments as needed  Problem List / ED Course: Problem List Items Addressed This Visit   None Visit  Diagnoses     Gross hematuria    -  Primary                   This note was created using dictation software, which may contain spelling or grammatical errors.    Loetta Rough, MD 09/03/22 1326

## 2022-09-03 NOTE — ED Notes (Addendum)
Blood redrawn for labs

## 2022-09-03 NOTE — Telephone Encounter (Signed)
  Chief Complaint: Hematuria Symptoms: Passing small clots Frequency: 1 episode Pertinent Negatives: Patient denies  Disposition: [x] ED /[] Urgent Care (no appt availability in office) / [] Appointment(In office/virtual)/ []  [facility]/ [] Home Care/ [] Refused Recommended Disposition /[] [facility]/ []  Follow-up with PCP Additional Notes: Pt seen in ED this afternoon. CT pelvis neg.  Placed on ATBs, made appt with Urology.  Calling now with small clots noted. Denies dizziness. Advised ED if clots continue next several voids, dizziness, increased pain.  Care advise provided, verbalizes understanding.  ED note: Patient with very reassuring CT abdomen pelvis. With lower back pain and gross hematuria with bacteria in the urine we will treat for pyelonephritis with ciprofloxacin PO BID x 7 days.  Patient is vitally stable and afebrile, no concern for sepsis/SIRS at this time. Patient and her boyfriend are informed that G/C test and urine culture will result as well. But with gross hematuria I recommend she f/u with urology as she may need further w/u such as cystoscopy. She is given urology info and instructed to call to make an appointment. Will be DC'd w/ DC instructions/return precautions. [HN]   PReason for Disposition  Blood in urine  (Exception: Could be normal menstrual bleeding.)  Answer Assessment - Initial Assessment Questions 1. COLOR of URINE: "Describe the color of the urine."  (e.g., tea-colored, pink, red, bloody) "Do you have blood clots in your urine?" (e.g., none, pea, grape, small coin)     Clots Tip of small finger 2. ONSET: "When did the bleeding start?"      Seen in ED today 3. EPISODES: "How many times has there been blood in the urine?" or "How many times today?"     First with clots 4. PAIN with URINATION: "Is there any pain with passing your urine?" If Yes, ask: "How bad is the pain?"  (Scale 1-10; or mild, moderate, severe)    - MILD: Complains  slightly about urination hurting.    - MODERATE: Interferes with normal activities.      - SEVERE: Excruciating, unwilling or unable to urinate because of the pain.       5. FEVER: "Do you have a fever?" If Yes, ask: "What is your temperature, how was it measured, and when did it start?"      6. ASSOCIATED SYMPTOMS: "Are you passing urine more frequently than usual?"      7. OTHER SYMPTOMS: "Do you have any other symptoms?" (e.g., back/flank pain, abdomen pain, vomiting)     Pain, ED noted  Protocols used: Urine - Blood In-A-AH

## 2022-09-04 LAB — URINE CULTURE: Culture: <10000 — AB

## 2022-09-06 LAB — GC/CHLAMYDIA PROBE AMP (~~LOC~~) NOT AT ARMC
Chlamydia: NEGATIVE
Comment: NEGATIVE
Comment: NORMAL
Neisseria Gonorrhea: NEGATIVE

## 2022-09-20 ENCOUNTER — Ambulatory Visit (INDEPENDENT_AMBULATORY_CARE_PROVIDER_SITE_OTHER): Payer: Medicaid Other | Admitting: Primary Care

## 2022-09-20 VITALS — BP 105/72 | HR 77 | Resp 16 | Ht 66.0 in | Wt 218.8 lb

## 2022-09-20 DIAGNOSIS — F17210 Nicotine dependence, cigarettes, uncomplicated: Secondary | ICD-10-CM

## 2022-09-20 DIAGNOSIS — E669 Obesity, unspecified: Secondary | ICD-10-CM | POA: Diagnosis not present

## 2022-09-20 DIAGNOSIS — F32A Depression, unspecified: Secondary | ICD-10-CM

## 2022-09-20 DIAGNOSIS — Z6835 Body mass index (BMI) 35.0-35.9, adult: Secondary | ICD-10-CM | POA: Diagnosis not present

## 2022-09-20 DIAGNOSIS — Z7689 Persons encountering health services in other specified circumstances: Secondary | ICD-10-CM

## 2022-09-20 NOTE — Progress Notes (Signed)
New Patient Office Visit  Subjective    Patient ID: Bonnie May, female    DOB: 09/29/1990  Age: 32 y.o. MRN: [id number]  CC:  Chief Complaint  Patient presents with   New Patient (Initial Visit)    HPI Bonnie May is a 32 year old obese female presents to establish care. She was referred by UC on 66 after a UTI an treated.    Outpatient Encounter Medications as of 09/20/2022  Medication Sig   cephALEXin (KEFLEX) 500 MG capsule Take 1 capsule (500 mg total) by mouth 2 (two) times daily. (Patient not taking: Reported on 09/03/2022)   phenazopyridine (PYRIDIUM) 200 MG tablet Take 1 tablet (200 mg total) by mouth 3 (three) times daily. (Patient not taking: Reported on 09/03/2022)   No facility-administered encounter medications on file as of 09/20/2022.    History reviewed. No pertinent past medical history.  History reviewed. No pertinent surgical history.  History reviewed. No pertinent family history.  Social History   Socioeconomic History   Marital status: Single    Spouse name: Not on file   Number of children: Not on file   Years of education: Not on file   Highest education level: Some college, no degree  Occupational History   Not on file  Tobacco Use   Smoking status: Every Day    Packs/day: .5    Types: Cigarettes   Smokeless tobacco: Not on file  Vaping Use   Vaping Use: Never used  Substance and Sexual Activity   Alcohol use: Yes    Comment: socially   Drug use: No   Sexual activity: Yes    Birth control/protection: None  Other Topics Concern   Not on file  Social History Narrative   Not on file   Social Determinants of Health   Financial Resource Strain: Medium Risk (09/20/2022)   Overall Financial Resource Strain (CARDIA)    Difficulty of Paying Living Expenses: Somewhat hard  Food Insecurity: Food Insecurity Present (09/20/2022)   Hunger Vital Sign    Worried About Running Out of Food in the Last Year: Sometimes true    Ran Out of  Food in the Last Year: Sometimes true  Transportation Needs: No Transportation Needs (09/20/2022)   PRAPARE - Administrator, Civil Service (Medical): No    Lack of Transportation (Non-Medical): No  Physical Activity: Insufficiently Active (09/20/2022)   Exercise Vital Sign    Days of Exercise per Week: 1 day    Minutes of Exercise per Session: 20 min  Stress: Stress Concern Present (09/20/2022)   [employer] of Occupational Health - Occupational Stress Questionnaire    Feeling of Stress : Very much  Social Connections: Moderately Isolated (09/20/2022)   Social Connection and Isolation Panel [NHANES]    Frequency of Communication with Friends and Family: Three times a week    Frequency of Social Gatherings with Friends and Family: Once a week    Attends Religious Services: 1 to 4 times per year    Active Member of [employer] or Organizations: No    Attends Banker Meetings: Not on file    Marital Status: Widowed  Intimate Partner Violence: Not on file    ROS Comprehensive ROS Pertinent positive and negative noted in HPI     Objective    Blood Pressure 105/72   Pulse 77   Respiration 16   Height 5\' 6"  (1.676 m)   Weight 218 lb 12.8 oz (99.2 kg)   Last  Menstrual Period 07/07/2022 (Approximate) Comment: neg upreg in er 09/03/22. pt has pcos.  Oxygen Saturation 100%   Body Mass Index 35.32 kg/m   Physical Exam Vitals reviewed.  Constitutional:      Appearance: She is obese.  HENT:     Head: Normocephalic.     Right Ear: Tympanic membrane and external ear normal.     Left Ear: Tympanic membrane and external ear normal.     Nose: Nose normal.  Eyes:     Extraocular Movements: Extraocular movements intact.     Pupils: Pupils are equal, round, and reactive to light.  Cardiovascular:     Rate and Rhythm: Normal rate and regular rhythm.  Pulmonary:     Effort: Pulmonary effort is normal.     Breath sounds: Normal breath sounds.  Abdominal:     General:  Bowel sounds are normal. There is distension.     Palpations: Abdomen is soft.  Musculoskeletal:        General: Normal range of motion.     Cervical back: Normal range of motion and neck supple.  Skin:    General: Skin is warm and dry.  Neurological:     Mental Status: She is alert and oriented to person, place, and time.  Psychiatric:        Mood and Affect: Mood normal.        Behavior: Behavior normal.       Assessment & Plan:  [name] was seen today for new patient (initial visit).  Diagnoses and all orders for this visit:  Encounter to establish care  Depressive disorder   Row Labels 09/20/2022   11:33 AM  Depression screen PHQ 2/9   Section Header. No data exists in this row.   Decreased Interest   2  Down, Depressed, Hopeless   3  PHQ - 2 Score   5  Altered sleeping   2  Tired, decreased energy   2  Change in appetite   2  Feeling bad or failure about yourself    1  Trouble concentrating   2  Moving slowly or fidgety/restless   2  Suicidal thoughts   0  PHQ-9 Score   16  Refer to [facility]  Grayce Sessions, NP

## 2022-09-27 ENCOUNTER — Telehealth (INDEPENDENT_AMBULATORY_CARE_PROVIDER_SITE_OTHER): Payer: Self-pay | Admitting: Licensed Clinical Social Worker

## 2022-09-27 ENCOUNTER — Encounter (INDEPENDENT_AMBULATORY_CARE_PROVIDER_SITE_OTHER): Payer: Self-pay

## 2022-09-27 NOTE — Telephone Encounter (Signed)
LCSWA called patient today to introduce herself and to assess patients' mental health needs. Patient was referred by PCP for Depressive disorder.  Sent counseling resources via MyChart.   Counseling Resources   [email or web address]  [facility] [address], [place], [place] [address] [phone] or [phone] Walk-in urgent care 24/7 for anyone  For [facility] ONLY New patient assessments and therapy walk-ins: Monday and Wednesday 8am-11am First and second Friday 1pm-5pm New patient psychiatry and medication management walk-ins: Mondays, Wednesdays, Thursdays, Fridays 8am-11am No psychiatry walk-ins on Tuesday   *Accepts all insurance and uninsured for Urgent Care needs *Accepts Medicaid and uninsured for outpatient treatment   [facility] (Therapy and psychiatry) [facility] (near [employer]) [address] [place], [place] [address] [phone] Fax: [phone] (INSURANCE REQUIRED-MEDICAID ACCEPTED)   [facility] Address: Four Locations  -[address] [place], [place], [place]                                 Phone: [phone] -[address], [place], [place]         Phone: [phone] -[address] [place], [place]                      Phone: [phone] -[address], [place], [place]         Phone: [phone] Age Range: Children, Adolescents, and Adults Specialty Areas: Depression, Anxiety, ADHD, Substance Abuse, Bipolar Disorder, etc.  [facility] Address: [address] [place], [place] Phone: [phone] Age Range: Children, Adults, and Elders Specialty Areas: Couple, Family, Group, Individual     [facility] Address: [address] [place], [place] Phone: [phone] Age Range: Children, Adolescents, and Adults Specialty Areas: Individual,  Family and Couples Therapy, and Substance Abuse  [facility] Address: [address] [place], [place] [address] Phone: [phone] Age Range: Adolescents/Teenagers and Adults Specialty Areas: Individual, Family, Couples, and Group Counseling   [facility] Address: [address] [place], [place] Phone: [phone] Age Range: Children and Adults Specialty Areas: Individual, Group, and Family counseling to people who have experienced domestic violence or sexual assault  [facility] Address: [address] [place], [place] [address] Phone: [phone] Age Range: Adults & Children (Ages 3+) Specialty Areas: Mental Health and Substance Abuse Counseling Services  [facility] Address: [address] [place], [place], [place] [address] Phone: [phone]             Age Range: All Ages  Specialty Areas: Common mental health diagnoses such as Anxiety, Depression, ADD/ADHD, Bipolar, and PTSD, Substance Abuse Evaluations and Counseling   [facility] at [facility] [address] [place] [address] [place],  [place]  [address] [phone] Call for appointment  [facility] (Therapy only)  [facility] [address], [place], [place] [address] Monday - Friday: 8:30 a.m.-12 p.m. / 1 p.m.-2:30 p.m.  [facility] [address], [place], [place] [address] Monday-Friday: 8:30 a.m.-12 p.m. / 2-3:30 p.m. (INSURANCE REQUIRED -MEDICAID ACCEPTED) They do offer a sliding fee scale $20-$30/session   [facility] [address] [place], [place] [address] Phone: [phone]  [facility] [address] [place] [address]  Phone: [phone] (Does not accept  Medicaid) (only one provider accepts Medicare)  [facility] [address] (at [employer], [place] [address] (Accepts Medicaid and Medicare)  [facility]) [address] [place] [address]  [place], [place] [address]  Phone: [phone]  [address] [place], [place] [address] Phone: [phone] [facility] Medicaid) [facility] (Therapy only)  [address] [place], [place] [address] Phone: [phone]   [facility] (Therapy only)  [address] [place], [place] [address] Phone: [phone] [facility] & Medicare)   [facility] [address], [place], [place] [address] Phone: [phone] (Accepts [place] Health Choice) [facility] [phone] [place], [place] [address] Phone: [phone] [facility]) [facility] (Psychiatry only)  [phone] [address], [place], [place] [address] (Accepts Medicaid and Medicare) [facility] (Psychiatry and therapy)  [address] [place] [place], [place] [address] [phone] [facility] Medicare) [facility] (psychiatry and therapy) [address], [place], [place] [address] [phone]  [facility] at [address], [place], [place] [address] [phone] Accept [address] [place], [place], [place], [place],  and the following types of Medicaid; Alliance, [place], Partners, [place], [place] Health Choice, [employer], [employer] [place], [place], and [place], as well as offering a Careers adviser and private payment options. Provides In-Office Appointments, Virtual Appointments, and Phone Consultations Offers medication management for ages 29 years old and up, including,  Medication Management for Suboxone and [facility] [phone] [address], [place], [place] [address] (Accepts Medicaid and Medicare)         19.  [facility] (therapy only)  [address] [place], [place] [address]            [phone] (Accepts medicare) 20. [facility]  (Suboxone and methodone) [phone] [address], [place], [place] [address] To Be Eligible for Opioid Treatment at [facility] you must be at least 34 years of age you have already tried other interventions that were not successful such as opioid detox, inpatient rehab for opioids, or outpatient counseling specifically for opioid dependency your [facility] drug test must be completely free of benzodiazepines (klonopin, xanax, valium, ativan, or other benz) you have reliable transportation to the [facility] clinic in [place] you recognize that counseling is a critical component of [facility]' Opioid Program and you agree to attend all required counseling sessions you are committed to total drug abstinence and will conscientiously strive to remain free of alcohol, marijuana, and other illicit substances while in treatment you desire a peaceful treatment atmosphere in which personal responsibility and respect toward staff and clients is the norm   21. [facility] [address] [place], [place], [place] [address] Offers SAIOP (Substance Abuse Intensive Outpatient Program) [phone] 22. [facility] [address] [place], [place] [address] [phone] (Accepts medicare)  For those who are tech savvy, go on psychology today, type in your local city ([place]. [place]) and specify your insurance at the top of the screen after you search. (Medicaid if needed). You can also specify whether you are interested in therapy and psychiatry.  [email or web address]/us

## 2022-09-29 ENCOUNTER — Ambulatory Visit (INDEPENDENT_AMBULATORY_CARE_PROVIDER_SITE_OTHER): Payer: Medicaid Other | Admitting: Primary Care

## 2022-10-07 ENCOUNTER — Ambulatory Visit (INDEPENDENT_AMBULATORY_CARE_PROVIDER_SITE_OTHER): Payer: Medicaid Other | Admitting: Primary Care

## 2022-11-13 ENCOUNTER — Encounter (HOSPITAL_BASED_OUTPATIENT_CLINIC_OR_DEPARTMENT_OTHER): Payer: Self-pay

## 2022-11-13 ENCOUNTER — Emergency Department (HOSPITAL_BASED_OUTPATIENT_CLINIC_OR_DEPARTMENT_OTHER)
Admission: EM | Admit: 2022-11-13 | Discharge: 2022-11-13 | Disposition: A | Discharge status: Home / Self Care | Payer: Medicaid Other | Attending: Emergency Medicine | Admitting: Emergency Medicine

## 2022-11-13 ENCOUNTER — Other Ambulatory Visit: Payer: Self-pay

## 2022-11-13 DIAGNOSIS — M545 Low back pain, unspecified: Secondary | ICD-10-CM | POA: Diagnosis not present

## 2022-11-13 DIAGNOSIS — Z3490 Encounter for supervision of normal pregnancy, unspecified, unspecified trimester: Secondary | ICD-10-CM

## 2022-11-13 DIAGNOSIS — Z3A00 Weeks of gestation of pregnancy not specified: Secondary | ICD-10-CM | POA: Diagnosis not present

## 2022-11-13 DIAGNOSIS — O26899 Other specified pregnancy related conditions, unspecified trimester: Secondary | ICD-10-CM | POA: Diagnosis present

## 2022-11-13 LAB — URINALYSIS, W/ REFLEX TO CULTURE (INFECTION SUSPECTED)
Bilirubin Urine: NEGATIVE
Glucose, UA: NEGATIVE mg/dL
Hgb urine dipstick: NEGATIVE
Ketones, ur: NEGATIVE mg/dL
Leukocytes,Ua: NEGATIVE
Nitrite: NEGATIVE
Protein, ur: NEGATIVE mg/dL
RBC / HPF: NONE SEEN RBC/hpf (ref 0–5)
Specific Gravity, Urine: 1.025 (ref 1.005–1.030)
pH: 6.5 (ref 5.0–8.0)

## 2022-11-13 LAB — PREGNANCY, URINE: Preg Test, Ur: POSITIVE — AB

## 2022-11-13 MED ORDER — DEXAMETHASONE SODIUM PHOSPHATE 10 MG/ML IJ SOLN
10.0000 mg | Freq: Once | INTRAMUSCULAR | Status: AC
  Administered 2022-11-13: 10 mg via INTRAMUSCULAR
  Filled 2022-11-13: qty 1

## 2022-11-13 MED ORDER — HYDROCODONE-ACETAMINOPHEN 5-325 MG PO TABS: 1.0000 | ORAL_TABLET | ORAL | 0 refills | Status: AC | PRN

## 2022-11-13 MED ORDER — MORPHINE SULFATE (PF) 4 MG/ML IV SOLN
6.0000 mg | Freq: Once | INTRAVENOUS | Status: AC
  Administered 2022-11-13: 6 mg via INTRAMUSCULAR
  Filled 2022-11-13: qty 2

## 2022-11-13 NOTE — ED Provider Notes (Signed)
[place] [facility] Provider Note   CSN: [id number] Arrival date & time: 11/13/22  8295     History  Chief Complaint  Patient presents with   Back Pain    Bonnie May is a 32 y.o. female.  Patient is a 32 year old female who presents with back pain.  She has had some worsening pain in her low back over the last 2 weeks.  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  She has pain to her lower back which radiates up and down her back at times.  Is worse with certain movements.  She denies any radiation down her legs.  She has had some intermittent tingling to the lateral part of her right foot.  No weakness to the legs.  No associate abdominal pain.  No nausea or vomiting.  No fevers.  No urinary symptoms.  She does state that she previously had a herniated disc in her back and was previously seen at [facility].       Home Medications Prior to Admission medications   Medication Sig Start Date End Date Taking? Authorizing Provider  HYDROcodone-acetaminophen (NORCO/VICODIN) 5-325 MG tablet Take 1-2 tablets by mouth every 4 (four) hours as needed. 11/13/22  Yes Rolan Bucco, MD  cephALEXin (KEFLEX) 500 MG capsule Take 1 capsule (500 mg total) by mouth 2 (two) times daily. Patient not taking: Reported on 09/03/2022 08/25/22   Palumbo, April, MD  phenazopyridine (PYRIDIUM) 200 MG tablet Take 1 tablet (200 mg total) by mouth 3 (three) times daily. Patient not taking: Reported on 09/03/2022 08/25/22   Palumbo, April, MD      Allergies    Nickel    Review of Systems   Review of Systems  Constitutional:  Negative for chills, diaphoresis, fatigue and fever.  Eyes: Negative.   Respiratory: Negative.    Cardiovascular: Negative.  Negative for leg swelling.  Gastrointestinal:  Negative for abdominal pain, nausea and vomiting.  Genitourinary:  Negative for difficulty urinating, flank pain and frequency.  Musculoskeletal:   Positive for back pain. Negative for arthralgias.  Skin:  Negative for rash.  Neurological:  Positive for numbness. Negative for speech difficulty, weakness and headaches.    Physical Exam Updated Vital Signs BP 126/76   Pulse (!) 104   Temp 98.8 F (37.1 C) (Oral)   Resp 18   Ht 5\' 6"  (1.676 m)   Wt 100 kg   LMP 09/12/2022 (Approximate)   SpO2 99%   BMI 35.58 kg/m  Physical Exam Constitutional:      Appearance: She is well-developed.  HENT:     Head: Normocephalic and atraumatic.  Eyes:     Pupils: Pupils are equal, round, and reactive to light.  Cardiovascular:     Rate and Rhythm: Normal rate and regular rhythm.     Heart sounds: Normal heart sounds.  Pulmonary:     Effort: Pulmonary effort is normal. No respiratory distress.     Breath sounds: Normal breath sounds. No wheezing or rales.  Chest:     Chest wall: No tenderness.  Abdominal:     General: Bowel sounds are normal.     Palpations: Abdomen is soft.     Tenderness: There is no abdominal tenderness. There is no guarding or rebound.  Musculoskeletal:        General: Normal range of motion.     Cervical back: Normal range of motion and neck supple.  Comments: Positive tenderness to the lower lumbar spine.  No step-offs or deformities.  Negative straight leg raise bilaterally.  Pedal pulses are intact.  She has normal motor function to lower extremities bilaterally.  She has some minimal change in sensation to the lateral aspect of the right foot but otherwise sensation is intact to light touch.  Patellar reflexes symmetric bilaterally.  Lymphadenopathy:     Cervical: No cervical adenopathy.  Skin:    General: Skin is warm and dry.     Findings: No rash.  Neurological:     Mental Status: She is alert and oriented to person, place, and time.     ED Results / Procedures / Treatments   Labs (all labs ordered are listed, but only abnormal results are displayed) Labs Reviewed  URINALYSIS, W/ REFLEX TO  CULTURE (INFECTION SUSPECTED) - Abnormal; Notable for the following components:      Result Value   APPearance HAZY (*)    Bacteria, UA FEW (*)    All other components within normal limits  PREGNANCY, URINE - Abnormal; Notable for the following components:   Preg Test, Ur POSITIVE (*)    All other components within normal limits    EKG None  Radiology No results found.  Procedures Procedures    Medications Ordered in ED Medications  morphine (PF) 4 MG/ML injection 6 mg (6 mg Intramuscular Given 11/13/22 1037)  dexamethasone (DECADRON) injection 10 mg (10 mg Intramuscular Given 11/13/22 1035)    ED Course/ Medical Decision Making/ A&P                                 Medical Decision Making Amount and/or Complexity of Data Reviewed External Data Reviewed: notes. Labs: ordered. Decision-making details documented in ED Course.  Risk Prescription drug management. Decision regarding hospitalization.   Patient is a 32 year old female who presents with low back pain.  She does not have any radicular symptoms other than some tingling intermittently in her right foot.  No other symptoms that would be more concerning for cauda equina.  She does not have any urinary symptoms and her urine is not consistent with infection.  No associate abdominal pain.  I suspect this is musculoskeletal.  She does not have any suggestions of a kidney stone.  Her pregnancy test is positive.  She initially had requested x-rays even though she has not had any recent reported trauma.  However given her positive pregnancy test.  I discussed the risk versus benefit and we have opted not to do x-rays.  I did advise her that she had some point may need an MRI if her symptoms are improving and that she will given a referral to orthopedics to follow-up regarding this.  Given her positive pregnancy test, I encouraged her to have close follow-up with her OB/GYN which she does indicates she has.  She does indicate that  she is not going to keep the pregnancy but I still encouraged her to follow-up with her OB/GYN.  She was given a shot of Decadron and morphine in the ED.  Will discharge her with a prescription for a short course of hydrocodone.  She was advised of medications that are safe in pregnancy.  She was given Vicodin precautions.  She was discharged home in good condition.  Return precautions were given.  Final Clinical Impression(s) / ED Diagnoses Final diagnoses:  Acute midline low back pain without sciatica  Pregnancy, unspecified  gestational age    Rx / DC Orders ED Discharge Orders          Ordered    HYDROcodone-acetaminophen (NORCO/VICODIN) 5-325 MG tablet  Every 4 hours PRN        11/13/22 1053              Rolan Bucco, MD 11/13/22 1100

## 2022-11-13 NOTE — Discharge Instructions (Addendum)
Follow-up with your OB/GYN regarding your pregnancy result.  Make an appointment to follow-up with the orthopedist if your back pain is not improving.  Return to the emergency room if you have any worsening symptoms.

## 2022-11-13 NOTE — ED Triage Notes (Signed)
The patient is having lower back pain for two weeks. She stated it got worse the last two days. 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 She stated she has no obvious injuries.

## 2023-11-06 ENCOUNTER — Other Ambulatory Visit: Payer: Self-pay

## 2023-11-06 ENCOUNTER — Encounter (HOSPITAL_BASED_OUTPATIENT_CLINIC_OR_DEPARTMENT_OTHER): Payer: Self-pay

## 2023-11-06 ENCOUNTER — Emergency Department (HOSPITAL_BASED_OUTPATIENT_CLINIC_OR_DEPARTMENT_OTHER)
Admission: EM | Admit: 2023-11-06 | Discharge: 2023-11-06 | Disposition: A | Discharge status: Home / Self Care | Attending: Emergency Medicine | Admitting: Emergency Medicine

## 2023-11-06 DIAGNOSIS — Z113 Encounter for screening for infections with a predominantly sexual mode of transmission: Secondary | ICD-10-CM | POA: Insufficient documentation

## 2023-11-06 LAB — HIV ANTIBODY (ROUTINE TESTING W REFLEX): HIV Screen 4th Generation wRfx: NONREACTIVE

## 2023-11-06 LAB — URINALYSIS, ROUTINE W REFLEX MICROSCOPIC
Bilirubin Urine: NEGATIVE
Glucose, UA: NEGATIVE mg/dL
Hgb urine dipstick: NEGATIVE
Ketones, ur: NEGATIVE mg/dL
Leukocytes,Ua: NEGATIVE
Nitrite: NEGATIVE
Protein, ur: NEGATIVE mg/dL
Specific Gravity, Urine: 1.025 (ref 1.005–1.030)
pH: 6 (ref 5.0–8.0)

## 2023-11-06 LAB — WET PREP, GENITAL
Clue Cells Wet Prep HPF POC: NONE SEEN
Sperm: NONE SEEN
Trich, Wet Prep: NONE SEEN
WBC, Wet Prep HPF POC: <10 (ref <10)
Yeast Wet Prep HPF POC: NONE SEEN

## 2023-11-06 LAB — PREGNANCY, URINE: Preg Test, Ur: NEGATIVE

## 2023-11-06 NOTE — Discharge Instructions (Addendum)
 Your urine and wet prep are unremarkable, as we discussed you can check the results of your gonorrhea, chlamydia, syphilis (RPR), and HIV tests on your patient portal in around 24 to 48 hours.  If you have any positive results please return for further evaluation and treatment, inform any of your sexual partners of your diagnosis.

## 2023-11-06 NOTE — ED Provider Notes (Signed)
 [place] [facility] Provider Note   CSN: [id number] Arrival date & time: 11/06/23  1146     Patient presents with: SEXUALLY TRANSMITTED DISEASE   Bonnie May is a 33 y.o. female with overall noncontributory past medical history who presents with concern for need for STI testing.  Denies any symptoms, reports that she had sex with a partner outside of her relationship.  Denies any vaginal discharge, burning, dysuria, fever, chills.   HPI     Prior to Admission medications   Medication Sig Start Date End Date Taking? Authorizing Provider  cephALEXin  (KEFLEX ) 500 MG capsule Take 1 capsule (500 mg total) by mouth 2 (two) times daily. Patient not taking: Reported on 09/03/2022 08/25/22   Palumbo, April, MD  HYDROcodone -acetaminophen  (NORCO/VICODIN) 5-325 MG tablet Take 1-2 tablets by mouth every 4 (four) hours as needed. 11/13/22   Lenor Hollering, MD  phenazopyridine  (PYRIDIUM ) 200 MG tablet Take 1 tablet (200 mg total) by mouth 3 (three) times daily. Patient not taking: Reported on 09/03/2022 08/25/22   Palumbo, April, MD    Allergies: Nickel    Review of Systems  All other systems reviewed and are negative.   Updated Vital Signs BP 115/78 (BP Location: Right Arm)   Pulse 80   Temp 98.2 F (36.8 C) (Oral)   Resp 18   Ht 5' 6 (1.676 m)   Wt 99.8 kg   SpO2 98%   BMI 35.51 kg/m   Physical Exam Vitals and nursing note reviewed.  Constitutional:      General: She is not in acute distress.    Appearance: Normal appearance.  HENT:     Head: Normocephalic and atraumatic.  Eyes:     General:        Right eye: No discharge.        Left eye: No discharge.  Cardiovascular:     Rate and Rhythm: Normal rate and regular rhythm.  Pulmonary:     Effort: Pulmonary effort is normal. No respiratory distress.  Genitourinary:    Comments: Given no symptoms at this time we will have patient self swab, pelvic exam deferred Musculoskeletal:         General: No deformity.  Skin:    General: Skin is warm and dry.  Neurological:     Mental Status: She is alert and oriented to person, place, and time.  Psychiatric:        Mood and Affect: Mood normal.        Behavior: Behavior normal.     (all labs ordered are listed, but only abnormal results are displayed) Labs Reviewed  WET PREP, GENITAL  URINALYSIS, ROUTINE W REFLEX MICROSCOPIC  PREGNANCY, URINE  RPR  HIV ANTIBODY (ROUTINE TESTING W REFLEX)  GC/CHLAMYDIA PROBE AMP ([place]) NOT AT [facility]    EKG: None  Radiology: No results found.   Procedures   Medications Ordered in the ED - No data to display                                  Medical Decision Making Amount and/or Complexity of Data Reviewed Labs: ordered.   This is an overall well-appearing 32 year old female who presents with concern for asymptomatic STD testing.  Denies any symptoms.  Reports 1 new sexual partner.  UA unremarkable, wet prep shows no acute abnormality. discussed with patient that GC/chlamydia, RPR, HIV will not result today.  Patient  reassured, and encouraged to check the results on her patient portal.  Return precautions given.  Final diagnoses:  Screening examination for STI    ED Discharge Orders     None          Rosan Sherlean DEL, PA-C 11/06/23 1425    Elnor Savant A, DO 11/08/23 [phone]

## 2023-11-06 NOTE — ED Triage Notes (Signed)
 Here for STD test, denies symptoms. Unsure of exposure.

## 2023-11-07 LAB — GC/CHLAMYDIA PROBE AMP (~~LOC~~) NOT AT ARMC
Chlamydia: NEGATIVE
Comment: NEGATIVE
Comment: NORMAL
Neisseria Gonorrhea: NEGATIVE

## 2023-11-07 LAB — RPR: RPR Ser Ql: NONREACTIVE
